# Patient Record
Sex: Female | Born: 1935 | Race: White | Hispanic: No | Marital: Single | Smoking: Never smoker
Health system: Southern US, Community
[De-identification: ages and names within clinical notes are randomized; demographics above are authoritative.]

## PROBLEM LIST (undated history)

## (undated) DIAGNOSIS — I509 Heart failure, unspecified: Secondary | ICD-10-CM

## (undated) DIAGNOSIS — I10 Essential (primary) hypertension: Secondary | ICD-10-CM

## (undated) DIAGNOSIS — F29 Unspecified psychosis not due to a substance or known physiological condition: Secondary | ICD-10-CM

## (undated) DIAGNOSIS — F0150 Vascular dementia without behavioral disturbance: Secondary | ICD-10-CM

## (undated) DIAGNOSIS — F0280 Dementia in other diseases classified elsewhere without behavioral disturbance: Secondary | ICD-10-CM

## (undated) DIAGNOSIS — G309 Alzheimer's disease, unspecified: Secondary | ICD-10-CM

---

## 2012-03-17 ENCOUNTER — Encounter (HOSPITAL_COMMUNITY): Payer: Self-pay | Admitting: *Deleted

## 2012-03-17 ENCOUNTER — Emergency Department (HOSPITAL_COMMUNITY)
Admission: EM | Admit: 2012-03-17 | Discharge: 2012-03-17 | Disposition: A | Discharge status: Home / Self Care | Payer: Medicare Other | Attending: Emergency Medicine | Admitting: Emergency Medicine

## 2012-03-17 ENCOUNTER — Emergency Department (HOSPITAL_COMMUNITY): Payer: Medicare Other

## 2012-03-17 DIAGNOSIS — R1110 Vomiting, unspecified: Secondary | ICD-10-CM | POA: Insufficient documentation

## 2012-03-17 DIAGNOSIS — F0280 Dementia in other diseases classified elsewhere without behavioral disturbance: Secondary | ICD-10-CM | POA: Insufficient documentation

## 2012-03-17 DIAGNOSIS — R197 Diarrhea, unspecified: Secondary | ICD-10-CM | POA: Insufficient documentation

## 2012-03-17 DIAGNOSIS — I10 Essential (primary) hypertension: Secondary | ICD-10-CM | POA: Insufficient documentation

## 2012-03-17 DIAGNOSIS — G309 Alzheimer's disease, unspecified: Secondary | ICD-10-CM | POA: Insufficient documentation

## 2012-03-17 DIAGNOSIS — E860 Dehydration: Secondary | ICD-10-CM | POA: Insufficient documentation

## 2012-03-17 DIAGNOSIS — Z79899 Other long term (current) drug therapy: Secondary | ICD-10-CM | POA: Insufficient documentation

## 2012-03-17 DIAGNOSIS — E876 Hypokalemia: Secondary | ICD-10-CM | POA: Insufficient documentation

## 2012-03-17 DIAGNOSIS — Z8659 Personal history of other mental and behavioral disorders: Secondary | ICD-10-CM | POA: Insufficient documentation

## 2012-03-17 HISTORY — DX: Vascular dementia, unspecified severity, without behavioral disturbance, psychotic disturbance, mood disturbance, and anxiety: F01.50

## 2012-03-17 HISTORY — DX: Unspecified psychosis not due to a substance or known physiological condition: F29

## 2012-03-17 HISTORY — DX: Essential (primary) hypertension: I10

## 2012-03-17 HISTORY — DX: Alzheimer's disease, unspecified: G30.9

## 2012-03-17 HISTORY — DX: Dementia in other diseases classified elsewhere, unspecified severity, without behavioral disturbance, psychotic disturbance, mood disturbance, and anxiety: F02.80

## 2012-03-17 LAB — COMPREHENSIVE METABOLIC PANEL
ALT: 30 U/L (ref 0–35)
Alkaline Phosphatase: 60 U/L (ref 39–117)
BUN: 42 mg/dL — ABNORMAL HIGH (ref 6–23)
CO2: 25 mEq/L (ref 19–32)
Calcium: 9.7 mg/dL (ref 8.4–10.5)
GFR calc Af Amer: 58 mL/min — ABNORMAL LOW (ref >90)
GFR calc non Af Amer: 50 mL/min — ABNORMAL LOW (ref >90)
Glucose, Bld: 373 mg/dL — ABNORMAL HIGH (ref 70–99)
Potassium: 2.8 mEq/L — ABNORMAL LOW (ref 3.5–5.1)
Sodium: 138 mEq/L (ref 135–145)

## 2012-03-17 LAB — URINALYSIS, ROUTINE W REFLEX MICROSCOPIC
Bilirubin Urine: NEGATIVE
Protein, ur: NEGATIVE mg/dL
Urobilinogen, UA: 0.2 mg/dL (ref 0.0–1.0)

## 2012-03-17 LAB — LIPASE, BLOOD: Lipase: 11 U/L (ref 11–59)

## 2012-03-17 LAB — CBC WITH DIFFERENTIAL/PLATELET
Eosinophils Relative: 0 % (ref 0–5)
Hemoglobin: 13.7 g/dL (ref 12.0–15.0)
Lymphocytes Relative: 7 % — ABNORMAL LOW (ref 12–46)
Lymphs Abs: 1 10*3/uL (ref 0.7–4.0)
MCV: 87 fL (ref 78.0–100.0)
Monocytes Relative: 5 % (ref 3–12)
Platelets: 244 10*3/uL (ref 150–400)
RBC: 4.53 MIL/uL (ref 3.87–5.11)
WBC: 13.8 10*3/uL — ABNORMAL HIGH (ref 4.0–10.5)

## 2012-03-17 MED ORDER — MORPHINE SULFATE 4 MG/ML IJ SOLN: 4.0000 mg | Freq: Once | INTRAMUSCULAR | Status: DC

## 2012-03-17 MED ORDER — SODIUM CHLORIDE 0.9 % IV SOLN
1000.0000 mL | INTRAVENOUS | Status: DC
  Administered 2012-03-17 (×2): 1000 mL via INTRAVENOUS

## 2012-03-17 MED ORDER — ONDANSETRON 8 MG PO TBDP: 8.0000 mg | ORAL_TABLET | Freq: Three times a day (TID) | ORAL | Status: DC | PRN

## 2012-03-17 MED ORDER — SODIUM CHLORIDE 0.9 % IV SOLN
1000.0000 mL | Freq: Once | INTRAVENOUS | Status: AC
  Administered 2012-03-17: 1000 mL via INTRAVENOUS

## 2012-03-17 MED ORDER — POTASSIUM CHLORIDE CRYS ER 20 MEQ PO TBCR
40.0000 meq | EXTENDED_RELEASE_TABLET | Freq: Once | ORAL | Status: AC
  Administered 2012-03-17: 40 meq via ORAL
  Filled 2012-03-17: qty 2

## 2012-03-17 MED ORDER — ONDANSETRON HCL 4 MG/2ML IJ SOLN
4.0000 mg | Freq: Once | INTRAMUSCULAR | Status: AC
  Administered 2012-03-17: 4 mg via INTRAVENOUS
  Filled 2012-03-17: qty 2

## 2012-03-17 MED ORDER — POTASSIUM CHLORIDE CRYS ER 20 MEQ PO TBCR: 20.0000 meq | EXTENDED_RELEASE_TABLET | Freq: Two times a day (BID) | ORAL | Status: DC

## 2012-03-17 MED ORDER — POTASSIUM CHLORIDE 10 MEQ/100ML IV SOLN
10.0000 meq | INTRAVENOUS | Status: AC
  Administered 2012-03-17 (×3): 10 meq via INTRAVENOUS
  Filled 2012-03-17 (×3): qty 100

## 2012-03-17 NOTE — ED Notes (Signed)
Returned from XR 

## 2012-03-17 NOTE — ED Notes (Signed)
AVW:[id number]<XB> Expected date:<BR> Expected time:<BR> Means of arrival:<BR> Comments:<BR> 79yo-UTI

## 2012-03-17 NOTE — ED Notes (Signed)
[facility] contacted to transport pt back to [facility].

## 2012-03-17 NOTE — ED Notes (Signed)
Pt arrives by [facility] from [facility] assisted [facility] for c/o abd pain.

## 2012-03-17 NOTE — ED Notes (Signed)
Patient transported to X-ray 

## 2012-03-17 NOTE — Progress Notes (Signed)
CSW confirmed patient is resident at [facility] [place]. Patient plans to return when medically stable. CSW gave rn number to facility [phone].   Marland KitchenCatha Gosselin, Theresia Majors  [phone] .03/17/2012 1902pm

## 2012-03-17 NOTE — Progress Notes (Signed)
CM noted pt without pcp listed CM checked snf forms sent with confused pt Cm found Dr Grace Isaac listed and updated EPIC

## 2012-03-17 NOTE — ED Provider Notes (Signed)
History     CSN: [id number]  Arrival date & time 03/17/12  1140   First MD Initiated Contact with Patient 03/17/12 1155      Chief Complaint  Patient presents with  . Abdominal Pain   Level V caveat for dementia  (Consider location/radiation/quality/duration/timing/severity/associated sxs/prior treatment) HPI  Patient presents via EMS from her assisted living facility. They report to EMS patient has been complaining of abdominal pain and having vomiting and diarrhea. Patient denies having any pain. She cannot tell me if she feels bad.  PCP Dr Nils Flack  Past Medical History  Diagnosis Date  . Alzheimers disease   . Vascular dementia   . Psychosis   . Hypertension     History reviewed. No pertinent past surgical history.  History reviewed. No pertinent family history.  History  Substance Use Topics  . Smoking status: No  . Smokeless tobacco: Not on file  . Alcohol Use: No   lives in a nursing home  OB History   Grav Para Term Preterm Abortions TAB SAB Ect Mult Living                  Review of Systems  Unable to perform ROS: Dementia    Allergies  Review of patient's allergies indicates no known allergies.  Home Medications   Current Outpatient Rx  Name  Route  Sig  Dispense  Refill  . acetaminophen (TYLENOL) 325 MG tablet   Oral   Take 650 mg by mouth every 4 (four) hours as needed for pain.         Marland Kitchen donepezil (ARICEPT) 10 MG tablet   Oral   Take 10 mg by mouth at bedtime as needed.         . Emollient (EUCERIN) lotion   Topical   Apply topically daily. Apply to bilateral arms and legs for dry skin         . Emollient (MINERIN EX)   Apply externally   Apply 1 application topically daily. For eucerin 12's, apply to affected areas (all extremities) every day         . escitalopram (LEXAPRO) 20 MG tablet   Oral   Take 20 mg by mouth daily.         . feeding supplement (ENSURE IMMUNE HEALTH) LIQD   Oral   Take 237 mLs by mouth 2  (two) times daily with a meal. vanilla         . fish oil-omega-3 fatty acids 1000 MG capsule   Oral   Take 1 g by mouth daily.         . furosemide (LASIX) 20 MG tablet   Oral   Take 20 mg by mouth every morning.         . Iloperidone (FANAPT) 2 MG TABS   Oral   Take 1 mg by mouth 2 (two) times daily. To clear thoughts         . lisinopril (PRINIVIL,ZESTRIL) 5 MG tablet   Oral   Take 5 mg by mouth every morning.         . memantine (NAMENDA) 10 MG tablet   Oral   Take 10 mg by mouth 2 (two) times daily.         . Methylfol-Methylcob-Acetylcyst (METAFOLBIC PLUS) 6-2-600 MG TABS   Oral   Take 1 tablet by mouth daily.         . ondansetron (ZOFRAN-ODT) 4 MG disintegrating tablet   Oral   Take  4 mg by mouth every 4 (four) hours as needed for nausea.         . simvastatin (ZOCOR) 10 MG tablet   Oral   Take 10 mg by mouth at bedtime.         . traMADol (ULTRAM) 50 MG tablet   Oral   Take 50 mg by mouth every 6 (six) hours as needed for pain.         . Vitamin D, Ergocalciferol, (DRISDOL) 50000 UNITS CAPS   Oral   Take 50,000 Units by mouth every Tuesday.           BP 187/89  Pulse 101  Temp(Src) 98.5 F (36.9 C) (Oral)  Resp 20  SpO2 98%  ,Vital signs normal except for hypertension, tachycardia   Physical Exam  Nursing note and vitals reviewed. Constitutional: She appears well-developed and well-nourished.  Non-toxic appearance. She does not appear ill. No distress.  Frail elderly female  HENT:  Head: Normocephalic and atraumatic.  Right Ear: External ear normal.  Left Ear: External ear normal.  Nose: Nose normal. No mucosal edema or rhinorrhea.  Mouth/Throat: Mucous membranes are normal. No dental abscesses or edematous.  Tongue and lips are dry and cracked  Eyes: Conjunctivae and EOM are normal. Pupils are equal, round, and reactive to light.  Neck: Normal range of motion and full passive range of motion without pain. Neck supple.   Cardiovascular: Normal rate, regular rhythm and normal heart sounds.  Exam reveals no gallop and no friction rub.   No murmur heard. Pulmonary/Chest: Effort normal and breath sounds normal. No respiratory distress. She has no wheezes. She has no rhonchi. She has no rales. She exhibits no tenderness and no crepitus.  Abdominal: Soft. Normal appearance and bowel sounds are normal. She exhibits no distension. There is no tenderness. There is no rebound and no guarding.  Musculoskeletal: Normal range of motion. She exhibits no edema and no tenderness.  Moves all extremities well.   Neurological: She is alert. She has normal strength. No cranial nerve deficit.  Patient will follow simple commands  Skin: Skin is warm, dry and intact. No rash noted. No erythema. There is pallor.  Psychiatric: She has a normal mood and affect. Her speech is normal and behavior is normal. Her mood appears not anxious.    ED Course  Procedures (including critical care time)  Medications  0.9 %  sodium chloride infusion (0 mLs Intravenous Stopped 03/17/12 1500)    Followed by  0.9 %  sodium chloride infusion (1,000 mLs Intravenous New Bag/Given 03/17/12 1349)  morphine 4 MG/ML injection 4 mg (4 mg Intravenous Not Given 03/17/12 1242)  potassium chloride 10 mEq in 100 mL IVPB (10 mEq Intravenous New Bag/Given 03/17/12 1529)  ondansetron (ZOFRAN) injection 4 mg (4 mg Intravenous Given 03/17/12 1418)  potassium chloride SA (K-DUR,KLOR-CON) CR tablet 40 mEq (40 mEq Oral Given 03/17/12 1443)   Patient's family here. They state she started having vomiting and diarrhea Saturday. They stated she seemed okay yesterday and was drinking gatorade.   Patient continues to not have any pain. She's not had any vomiting or diarrhea and her ED visit.  She had her hypokalemia supplemented with IV and oral potassium.  Results for orders placed during the hospital encounter of 03/17/12  CBC WITH DIFFERENTIAL      Result Value Range    WBC 13.8 (*) 4.0 - 10.5 K/uL   RBC 4.53  3.87 - 5.11 MIL/uL   Hemoglobin 13.7  12.0 - 15.0 g/dL   HCT 16.1  09.6 - 04.5 %   MCV 87.0  78.0 - 100.0 fL   MCH 30.2  26.0 - 34.0 pg   MCHC 34.8  30.0 - 36.0 g/dL   RDW 40.9  81.1 - 91.4 %   Platelets 244  150 - 400 K/uL   Neutrophils Relative 87 (*) 43 - 77 %   Neutro Abs 12.0 (*) 1.7 - 7.7 K/uL   Lymphocytes Relative 7 (*) 12 - 46 %   Lymphs Abs 1.0  0.7 - 4.0 K/uL   Monocytes Relative 5  3 - 12 %   Monocytes Absolute 0.7  0.1 - 1.0 K/uL   Eosinophils Relative 0  0 - 5 %   Eosinophils Absolute 0.0  0.0 - 0.7 K/uL   Basophils Relative 0  0 - 1 %   Basophils Absolute 0.0  0.0 - 0.1 K/uL  COMPREHENSIVE METABOLIC PANEL      Result Value Range   Sodium 138  135 - 145 mEq/L   Potassium 2.8 (*) 3.5 - 5.1 mEq/L   Chloride 98  96 - 112 mEq/L   CO2 25  19 - 32 mEq/L   Glucose, Bld 373 (*) 70 - 99 mg/dL   BUN 42 (*) 6 - 23 mg/dL   Creatinine, Ser 7.82  0.50 - 1.10 mg/dL   Calcium 9.7  8.4 - 95.6 mg/dL   Total Protein 7.2  6.0 - 8.3 g/dL   Albumin 4.3  3.5 - 5.2 g/dL   AST 21  0 - 37 U/L   ALT 30  0 - 35 U/L   Alkaline Phosphatase 60  39 - 117 U/L   Total Bilirubin 1.0  0.3 - 1.2 mg/dL   GFR calc non Af Amer 50 (*) >90 mL/min   GFR calc Af Amer 58 (*) >90 mL/min  LIPASE, BLOOD      Result Value Range   Lipase 11  11 - 59 U/L  URINALYSIS, ROUTINE W REFLEX MICROSCOPIC      Result Value Range   Color, Urine YELLOW  YELLOW   APPearance CLEAR  CLEAR   Specific Gravity, Urine 1.017  1.005 - 1.030   pH 5.0  5.0 - 8.0   Glucose, UA 500 (*) NEGATIVE mg/dL   Hgb urine dipstick TRACE (*) NEGATIVE   Bilirubin Urine NEGATIVE  NEGATIVE   Ketones, ur NEGATIVE  NEGATIVE mg/dL   Protein, ur NEGATIVE  NEGATIVE mg/dL   Urobilinogen, UA 0.2  0.0 - 1.0 mg/dL   Nitrite NEGATIVE  NEGATIVE   Leukocytes, UA NEGATIVE  NEGATIVE  URINE MICROSCOPIC-ADD ON      Result Value Range   Squamous Epithelial / LPF RARE  RARE   WBC, UA 0-2  <3 WBC/hpf   RBC / HPF  0-2  <3 RBC/hpf   Urine-Other AMORPHOUS URATES/PHOSPHATES     Laboratory interpretation all normal except for leukocytosis consistent with vomiting, hyperglycemia, elevated BUN consistent with dehydration    Dg Abd Acute W/chest  03/17/2012  *RADIOLOGY REPORT*  Clinical Data: Abdominal pain.  History of dementia  ACUTE ABDOMEN SERIES (ABDOMEN 2 VIEW & CHEST 1 VIEW)  Comparison: None.  Findings: Heart size is normal.  No pleural effusion or edema.  No airspace consolidation identified.  Chronic interstitial coarsening is noted bilaterally.  Degenerative type changes noted within both glenohumeral joints.  Cholecystectomy clips noted in the right upper quadrant of the abdomen.  There are no dilated loops of  small bowel or air-fluid level.  Scoliosis deformity and multilevel degenerative disc disease noted within the lumbar spine.  IMPRESSION:  1.  No acute cardiopulmonary abnormalities. 2.  Nonobstructive bowel gas pattern.   Original Report Authenticated By: Signa Kell, M.D.      1. Vomiting and diarrhea   2. Dehydration   3. Hypokalemia    New Prescriptions   ONDANSETRON (ZOFRAN ODT) 8 MG DISINTEGRATING TABLET    Take 1 tablet (8 mg total) by mouth every 8 (eight) hours as needed for nausea.   POTASSIUM CHLORIDE SA (K-DUR,KLOR-CON) 20 MEQ TABLET    Take 1 tablet (20 mEq total) by mouth 2 (two) times daily.    Plan discharge  Devoria Albe, MD, FACEP    MDM          Ward Givens, MD 03/17/12 [phone]

## 2012-04-16 ENCOUNTER — Inpatient Hospital Stay (HOSPITAL_COMMUNITY)
Admission: EM | Admit: 2012-04-16 | Discharge: 2012-04-23 | DRG: 690 | Disposition: A | Discharge status: Skilled Nursing Facility | Payer: Medicare Other | Attending: Internal Medicine | Admitting: Internal Medicine

## 2012-04-16 ENCOUNTER — Encounter (HOSPITAL_COMMUNITY): Payer: Self-pay | Admitting: *Deleted

## 2012-04-16 DIAGNOSIS — N390 Urinary tract infection, site not specified: Principal | ICD-10-CM | POA: Diagnosis present

## 2012-04-16 DIAGNOSIS — E870 Hyperosmolality and hypernatremia: Secondary | ICD-10-CM | POA: Diagnosis present

## 2012-04-16 DIAGNOSIS — K5900 Constipation, unspecified: Secondary | ICD-10-CM | POA: Diagnosis not present

## 2012-04-16 DIAGNOSIS — I498 Other specified cardiac arrhythmias: Secondary | ICD-10-CM | POA: Diagnosis present

## 2012-04-16 DIAGNOSIS — R4701 Aphasia: Secondary | ICD-10-CM | POA: Diagnosis present

## 2012-04-16 DIAGNOSIS — A498 Other bacterial infections of unspecified site: Secondary | ICD-10-CM | POA: Diagnosis present

## 2012-04-16 DIAGNOSIS — W06XXXA Fall from bed, initial encounter: Secondary | ICD-10-CM | POA: Diagnosis present

## 2012-04-16 DIAGNOSIS — G309 Alzheimer's disease, unspecified: Secondary | ICD-10-CM | POA: Diagnosis present

## 2012-04-16 DIAGNOSIS — Y998 Other external cause status: Secondary | ICD-10-CM

## 2012-04-16 DIAGNOSIS — R197 Diarrhea, unspecified: Secondary | ICD-10-CM | POA: Diagnosis not present

## 2012-04-16 DIAGNOSIS — F0280 Dementia in other diseases classified elsewhere without behavioral disturbance: Secondary | ICD-10-CM | POA: Diagnosis present

## 2012-04-16 DIAGNOSIS — F0390 Unspecified dementia without behavioral disturbance: Secondary | ICD-10-CM | POA: Diagnosis present

## 2012-04-16 DIAGNOSIS — I10 Essential (primary) hypertension: Secondary | ICD-10-CM | POA: Diagnosis present

## 2012-04-16 DIAGNOSIS — Z66 Do not resuscitate: Secondary | ICD-10-CM | POA: Diagnosis present

## 2012-04-16 DIAGNOSIS — I509 Heart failure, unspecified: Secondary | ICD-10-CM | POA: Diagnosis present

## 2012-04-16 DIAGNOSIS — IMO0002 Reserved for concepts with insufficient information to code with codable children: Secondary | ICD-10-CM | POA: Diagnosis present

## 2012-04-16 DIAGNOSIS — Y9210 Unspecified residential institution as the place of occurrence of the external cause: Secondary | ICD-10-CM | POA: Diagnosis present

## 2012-04-16 DIAGNOSIS — Z79899 Other long term (current) drug therapy: Secondary | ICD-10-CM

## 2012-04-16 DIAGNOSIS — E860 Dehydration: Secondary | ICD-10-CM | POA: Diagnosis not present

## 2012-04-16 DIAGNOSIS — R739 Hyperglycemia, unspecified: Secondary | ICD-10-CM

## 2012-04-16 DIAGNOSIS — E118 Type 2 diabetes mellitus with unspecified complications: Secondary | ICD-10-CM | POA: Diagnosis present

## 2012-04-16 DIAGNOSIS — W19XXXA Unspecified fall, initial encounter: Secondary | ICD-10-CM

## 2012-04-16 HISTORY — DX: Heart failure, unspecified: I50.9

## 2012-04-16 NOTE — ED Notes (Signed)
[facility]: according to staff, pt. Rolled out of bed..unwitnessed. Fire department: confusion .Marland Kitchenhx of dementia/confused.

## 2012-04-17 ENCOUNTER — Emergency Department (HOSPITAL_COMMUNITY): Payer: Medicare Other

## 2012-04-17 ENCOUNTER — Encounter (HOSPITAL_COMMUNITY): Payer: Self-pay | Admitting: *Deleted

## 2012-04-17 DIAGNOSIS — R197 Diarrhea, unspecified: Secondary | ICD-10-CM | POA: Diagnosis not present

## 2012-04-17 DIAGNOSIS — I10 Essential (primary) hypertension: Secondary | ICD-10-CM | POA: Diagnosis present

## 2012-04-17 DIAGNOSIS — F0280 Dementia in other diseases classified elsewhere without behavioral disturbance: Secondary | ICD-10-CM | POA: Diagnosis present

## 2012-04-17 DIAGNOSIS — IMO0002 Reserved for concepts with insufficient information to code with codable children: Secondary | ICD-10-CM | POA: Diagnosis present

## 2012-04-17 DIAGNOSIS — Y9210 Unspecified residential institution as the place of occurrence of the external cause: Secondary | ICD-10-CM | POA: Diagnosis present

## 2012-04-17 DIAGNOSIS — E860 Dehydration: Secondary | ICD-10-CM

## 2012-04-17 DIAGNOSIS — K5900 Constipation, unspecified: Secondary | ICD-10-CM | POA: Diagnosis not present

## 2012-04-17 DIAGNOSIS — W06XXXA Fall from bed, initial encounter: Secondary | ICD-10-CM | POA: Diagnosis present

## 2012-04-17 DIAGNOSIS — R4701 Aphasia: Secondary | ICD-10-CM | POA: Diagnosis present

## 2012-04-17 DIAGNOSIS — E870 Hyperosmolality and hypernatremia: Secondary | ICD-10-CM | POA: Diagnosis present

## 2012-04-17 DIAGNOSIS — A498 Other bacterial infections of unspecified site: Secondary | ICD-10-CM | POA: Diagnosis present

## 2012-04-17 DIAGNOSIS — Y998 Other external cause status: Secondary | ICD-10-CM | POA: Diagnosis not present

## 2012-04-17 DIAGNOSIS — I509 Heart failure, unspecified: Secondary | ICD-10-CM | POA: Diagnosis present

## 2012-04-17 DIAGNOSIS — Z79899 Other long term (current) drug therapy: Secondary | ICD-10-CM | POA: Diagnosis not present

## 2012-04-17 DIAGNOSIS — N390 Urinary tract infection, site not specified: Secondary | ICD-10-CM | POA: Diagnosis present

## 2012-04-17 DIAGNOSIS — W19XXXA Unspecified fall, initial encounter: Secondary | ICD-10-CM

## 2012-04-17 DIAGNOSIS — I498 Other specified cardiac arrhythmias: Secondary | ICD-10-CM | POA: Diagnosis present

## 2012-04-17 DIAGNOSIS — F0390 Unspecified dementia without behavioral disturbance: Secondary | ICD-10-CM | POA: Diagnosis present

## 2012-04-17 DIAGNOSIS — E118 Type 2 diabetes mellitus with unspecified complications: Secondary | ICD-10-CM | POA: Diagnosis present

## 2012-04-17 LAB — URINALYSIS, MICROSCOPIC ONLY
Bilirubin Urine: NEGATIVE
Ketones, ur: NEGATIVE mg/dL
Nitrite: POSITIVE — AB
Protein, ur: 30 mg/dL — AB
Specific Gravity, Urine: 1.028 (ref 1.005–1.030)
Urobilinogen, UA: 0.2 mg/dL (ref 0.0–1.0)

## 2012-04-17 LAB — POCT I-STAT, CHEM 8
BUN: 33 mg/dL — ABNORMAL HIGH (ref 6–23)
Calcium, Ion: 1.15 mmol/L (ref 1.13–1.30)
Chloride: 111 mEq/L (ref 96–112)
Creatinine, Ser: 1 mg/dL (ref 0.50–1.10)
Glucose, Bld: 399 mg/dL — ABNORMAL HIGH (ref 70–99)
HCT: 42 % (ref 36.0–46.0)
Potassium: 3.7 mEq/L (ref 3.5–5.1)

## 2012-04-17 LAB — BASIC METABOLIC PANEL
BUN: 29 mg/dL — ABNORMAL HIGH (ref 6–23)
CO2: 26 mEq/L (ref 19–32)
Chloride: 117 mEq/L — ABNORMAL HIGH (ref 96–112)
Glucose, Bld: 286 mg/dL — ABNORMAL HIGH (ref 70–99)
Potassium: 3.2 mEq/L — ABNORMAL LOW (ref 3.5–5.1)
Sodium: 152 mEq/L — ABNORMAL HIGH (ref 135–145)

## 2012-04-17 LAB — POCT I-STAT 3, VENOUS BLOOD GAS (G3P V)
Acid-Base Excess: 3 mmol/L — ABNORMAL HIGH (ref 0.0–2.0)
Bicarbonate: 26.8 mEq/L — ABNORMAL HIGH (ref 20.0–24.0)
pCO2, Ven: 36 mmHg — ABNORMAL LOW (ref 45.0–50.0)
pH, Ven: 7.48 — ABNORMAL HIGH (ref 7.250–7.300)
pO2, Ven: 52 mmHg — ABNORMAL HIGH (ref 30.0–45.0)

## 2012-04-17 LAB — GLUCOSE, CAPILLARY: Glucose-Capillary: 162 mg/dL — ABNORMAL HIGH (ref 70–99)

## 2012-04-17 LAB — TROPONIN I: Troponin I: <0.3 ng/mL (ref <0.30)

## 2012-04-17 LAB — HEMOGLOBIN A1C
Hgb A1c MFr Bld: 8.8 % — ABNORMAL HIGH (ref <5.7)
Mean Plasma Glucose: 206 mg/dL — ABNORMAL HIGH (ref <117)

## 2012-04-17 LAB — KETONES, QUALITATIVE

## 2012-04-17 LAB — PROTIME-INR: Prothrombin Time: 14.6 seconds (ref 11.6–15.2)

## 2012-04-17 MED ORDER — ENSURE IMMUNE HEALTH PO LIQD: 237.0000 mL | Freq: Two times a day (BID) | ORAL | Status: DC

## 2012-04-17 MED ORDER — MEMANTINE HCL 10 MG PO TABS
10.0000 mg | ORAL_TABLET | Freq: Two times a day (BID) | ORAL | Status: DC
  Administered 2012-04-18 – 2012-04-23 (×11): 10 mg via ORAL
  Filled 2012-04-17 (×14): qty 1

## 2012-04-17 MED ORDER — SENNA 8.6 MG PO TABS
1.0000 | ORAL_TABLET | Freq: Two times a day (BID) | ORAL | Status: DC
  Administered 2012-04-18 – 2012-04-23 (×9): 8.6 mg via ORAL
  Filled 2012-04-17 (×14): qty 1

## 2012-04-17 MED ORDER — DEXTROSE 5 % IV SOLN
INTRAVENOUS | Status: DC
  Administered 2012-04-17 – 2012-04-19 (×4): via INTRAVENOUS

## 2012-04-17 MED ORDER — ENSURE COMPLETE PO LIQD
237.0000 mL | Freq: Two times a day (BID) | ORAL | Status: DC
  Administered 2012-04-18 – 2012-04-23 (×10): 237 mL via ORAL

## 2012-04-17 MED ORDER — ONDANSETRON HCL 4 MG/2ML IJ SOLN: 4.0000 mg | Freq: Three times a day (TID) | INTRAMUSCULAR | Status: AC | PRN

## 2012-04-17 MED ORDER — INSULIN ASPART 100 UNIT/ML ~~LOC~~ SOLN
5.0000 [IU] | Freq: Once | SUBCUTANEOUS | Status: AC
  Administered 2012-04-17: 5 [IU] via INTRAVENOUS
  Filled 2012-04-17: qty 5

## 2012-04-17 MED ORDER — SIMVASTATIN 20 MG PO TABS
20.0000 mg | ORAL_TABLET | Freq: Every evening | ORAL | Status: DC
  Administered 2012-04-18 – 2012-04-22 (×5): 20 mg via ORAL
  Filled 2012-04-17 (×7): qty 1

## 2012-04-17 MED ORDER — INSULIN ASPART 100 UNIT/ML ~~LOC~~ SOLN
0.0000 [IU] | Freq: Three times a day (TID) | SUBCUTANEOUS | Status: DC
  Administered 2012-04-17: 2 [IU] via SUBCUTANEOUS
  Administered 2012-04-17: 7 [IU] via SUBCUTANEOUS
  Administered 2012-04-17: 3 [IU] via SUBCUTANEOUS
  Administered 2012-04-18: 2 [IU] via SUBCUTANEOUS
  Administered 2012-04-18 (×2): 5 [IU] via SUBCUTANEOUS

## 2012-04-17 MED ORDER — SODIUM CHLORIDE 0.45 % IV SOLN
INTRAVENOUS | Status: DC
  Administered 2012-04-17: 05:00:00 via INTRAVENOUS

## 2012-04-17 MED ORDER — CHLORHEXIDINE GLUCONATE CLOTH 2 % EX PADS
6.0000 | MEDICATED_PAD | Freq: Every day | CUTANEOUS | Status: AC
  Administered 2012-04-17 – 2012-04-21 (×5): 6 via TOPICAL

## 2012-04-17 MED ORDER — DEXTROSE 5 % IV SOLN
1.0000 g | INTRAVENOUS | Status: DC
  Administered 2012-04-17 – 2012-04-22 (×7): 1 g via INTRAVENOUS
  Filled 2012-04-17 (×6): qty 10

## 2012-04-17 MED ORDER — SODIUM CHLORIDE 0.9 % IV BOLUS (SEPSIS)
1000.0000 mL | Freq: Once | INTRAVENOUS | Status: AC
  Administered 2012-04-17: 1000 mL via INTRAVENOUS

## 2012-04-17 MED ORDER — ACETAMINOPHEN 650 MG RE SUPP
650.0000 mg | Freq: Four times a day (QID) | RECTAL | Status: DC | PRN
  Administered 2012-04-17: 650 mg via RECTAL
  Filled 2012-04-17 (×2): qty 1

## 2012-04-17 MED ORDER — MUPIROCIN 2 % EX OINT
1.0000 "application " | TOPICAL_OINTMENT | Freq: Two times a day (BID) | CUTANEOUS | Status: AC
  Administered 2012-04-17 – 2012-04-21 (×10): 1 via NASAL
  Filled 2012-04-17 (×2): qty 22

## 2012-04-17 MED ORDER — SODIUM CHLORIDE 0.9 % IV SOLN
INTRAVENOUS | Status: DC
  Filled 2012-04-17: qty 1

## 2012-04-17 MED ORDER — DEXTROSE 5 % IV SOLN: 1.0000 g | INTRAVENOUS | Status: DC

## 2012-04-17 MED ORDER — ENOXAPARIN SODIUM 40 MG/0.4ML ~~LOC~~ SOLN
40.0000 mg | SUBCUTANEOUS | Status: DC
  Administered 2012-04-17 – 2012-04-23 (×5): 40 mg via SUBCUTANEOUS
  Filled 2012-04-17 (×9): qty 0.4

## 2012-04-17 MED ORDER — SODIUM CHLORIDE 0.9 % IV SOLN
INTRAVENOUS | Status: AC
  Administered 2012-04-17: 03:00:00 via INTRAVENOUS

## 2012-04-17 MED ORDER — TRAMADOL HCL 50 MG PO TABS
50.0000 mg | ORAL_TABLET | Freq: Four times a day (QID) | ORAL | Status: DC | PRN
  Filled 2012-04-17: qty 1

## 2012-04-17 MED ORDER — LISINOPRIL 5 MG PO TABS
5.0000 mg | ORAL_TABLET | Freq: Every morning | ORAL | Status: DC
  Administered 2012-04-18: 5 mg via ORAL
  Filled 2012-04-17 (×2): qty 1

## 2012-04-17 MED ORDER — ILOPERIDONE 2 MG PO TABS
1.0000 mg | ORAL_TABLET | Freq: Two times a day (BID) | ORAL | Status: DC
  Administered 2012-04-19 – 2012-04-23 (×9): 1 mg via ORAL
  Filled 2012-04-17 (×17): qty 1

## 2012-04-17 MED ORDER — ESCITALOPRAM OXALATE 20 MG PO TABS
20.0000 mg | ORAL_TABLET | Freq: Every day | ORAL | Status: DC
  Administered 2012-04-18 – 2012-04-23 (×6): 20 mg via ORAL
  Filled 2012-04-17 (×7): qty 1

## 2012-04-17 MED ORDER — DONEPEZIL HCL 10 MG PO TABS
10.0000 mg | ORAL_TABLET | Freq: Every day | ORAL | Status: DC
  Administered 2012-04-19 – 2012-04-22 (×5): 10 mg via ORAL
  Filled 2012-04-17 (×7): qty 1

## 2012-04-17 NOTE — H&P (Signed)
[facility] ADMISSION NOTE  History and Physical  [name] [name] ZOX:[id number] DOB: 1921/05/07 DOA: 04/16/2012  Referring physician: ER PCP: Jari Favre, MD   Chief Complaint: fall  HPI: 79 year old woman with past medical history significant for alzheimer's disease , resident of [facility] nursing [facility] presents to the ED with a fall from the bed. Patient is completely aphasic from advanced dementia and all the history has been obtained from ER chart and son.  She was noted to have abrasions on her legs. Labs suggested hyperglycemia and hypernatremia. ER physician wanted to admit for rehydration.  Review of Systems:  Unable to perform ROS because of dementia.  Past Medical History  Diagnosis Date  . Alzheimers disease   . Vascular dementia   . Psychosis   . Hypertension   . CHF (congestive heart failure)     No past surgical history on file.  Social History:  has no tobacco, alcohol, and drug history on file.  No Known Allergies  No family history on file.   Prior to Admission medications   Medication Sig Start Date End Date Taking? Authorizing Provider  acetaminophen (TYLENOL) 325 MG tablet Take 650 mg by mouth every 4 (four) hours as needed for pain.   Yes Historical Provider, MD  bismuth subsalicylate (PEPTO BISMOL) 262 MG/15ML suspension Take 30 mLs by mouth every hour as needed for indigestion (Max 4 doses/24 hours.).   Yes Historical Provider, MD  donepezil (ARICEPT) 10 MG tablet Take 10 mg by mouth at bedtime.    Yes Historical Provider, MD  Emollient (EUCERIN) lotion Apply topically daily. Apply to bilateral arms and legs for dry skin   Yes Historical Provider, MD  escitalopram (LEXAPRO) 20 MG tablet Take 20 mg by mouth daily.   Yes Historical Provider, MD  feeding supplement (ENSURE IMMUNE HEALTH) LIQD Take 237 mLs by mouth 2 (two) times daily with a meal. Vanilla.   Yes Historical Provider, MD  fish oil-omega-3 fatty acids 1000 MG capsule Take 1 g by mouth  daily.   Yes Historical Provider, MD  furosemide (LASIX) 20 MG tablet Take 20 mg by mouth every morning.   Yes Historical Provider, MD  guaifenesin (ROBITUSSIN) 100 MG/5ML syrup Take 100 mg by mouth 3 (three) times daily as needed for cough.   Yes Historical Provider, MD  Iloperidone (FANAPT) 2 MG TABS Take 1 mg by mouth 2 (two) times daily. To clear thoughts   Yes Historical Provider, MD  lisinopril (PRINIVIL,ZESTRIL) 5 MG tablet Take 5 mg by mouth every morning.   Yes Historical Provider, MD  memantine (NAMENDA) 10 MG tablet Take 10 mg by mouth 2 (two) times daily.   Yes Historical Provider, MD  Methylfol-Methylcob-Acetylcyst (METAFOLBIC PLUS) 6-2-600 MG TABS Take 1 tablet by mouth daily.   Yes Historical Provider, MD  ondansetron (ZOFRAN ODT) 8 MG disintegrating tablet Take 1 tablet (8 mg total) by mouth every 8 (eight) hours as needed for nausea. 03/17/12  Yes Ward Givens, MD  potassium chloride SA (K-DUR,KLOR-CON) 20 MEQ tablet Take 1 tablet (20 mEq total) by mouth 2 (two) times daily. 03/17/12  Yes Ward Givens, MD  simvastatin (ZOCOR) 20 MG tablet Take 20 mg by mouth every evening.   Yes Historical Provider, MD  traMADol (ULTRAM) 50 MG tablet Take 50 mg by mouth every 6 (six) hours as needed for pain.   Yes Historical Provider, MD  Vitamin D, Ergocalciferol, (DRISDOL) 50000 UNITS CAPS Take 50,000 Units by mouth every Tuesday.  Yes Historical Provider, MD   Physical Exam: Filed Vitals:   04/17/12 0100 04/17/12 0115 04/17/12 0130 04/17/12 0145  BP: 141/87 139/76 142/78 148/66  Pulse: 127 121 97 116  Temp:  97.3 F (36.3 C) 97.9 F (36.6 C) 97.7 F (36.5 C)  Resp: 17 16 12 16   SpO2: 97% 98% 96% 99%   Constitutional:  She appears well-developed and well-nourished.  HENT: Normocephalic and atraumatic. Dry mucous membranes Conjunctivae and EOM are normal. Pupils are equal, round, and reactive to light.  Neck: Neck supple, no lymphadenopathy Cardiovascular: RRR. No murmur  heard. Pulmonary/Chest: Effort normal and breath sounds normal. No respiratory distress.  Abdominal: Soft, non tender, positive bowel sounds, no organomegaly Musculoskeletal: Normal range of motion. She exhibits no edema and no tenderness. Abrasion right shin  Neurological: Difficult to obtain as she does not follow Commands. Skin: Skin is warm.    Wt Readings from Last 3 Encounters:  No data found for Wt    Labs on Admission:  Basic Metabolic Panel:  Recent Labs Lab 04/17/12 0040  NA 149*  K 3.7  CL 111  GLUCOSE 399*  BUN 33*  CREATININE 1.00     CBC:  Recent Labs Lab 04/17/12 0040  HGB 14.3  HCT 42.0    Cardiac Enzymes:  Recent Labs Lab 04/17/12 0008  TROPONINI <0.30    CBG:  Recent Labs Lab 04/16/12 2351  GLUCAP 357*     Radiological Exams on Admission: Dg Chest Portable 1 View  04/17/2012  *RADIOLOGY REPORT*  Clinical Data: Altered level of consciousness.  Fell.  PORTABLE CHEST - 1 VIEW  Comparison: 03/17/2012.  Findings: The cardiac silhouette, mediastinal and hilar contours are normal and stable.  There is tortuosity and calcification of the thoracic aorta.  The lungs are clear of acute process.  No pleural effusion.  The bony thorax is intact.  IMPRESSION: No acute cardiopulmonary findings.   Original Report Authenticated By: Rudie Meyer, M.D.     EKG: Independently reviewed. 137 BPM, sinus tachycardia, non specific st and t wave changes. No previous ekg   Principal Problem:   Dehydration Active Problems:   Fall   UTI (lower urinary tract infection)   Dementia   CHF (congestive heart failure)   HTN (hypertension)   Assessment/Plan:  Fall: Patient presents from nursing home with a fall. The exact nature of her fall is unclear but could be elated to hypernatremia, dehydration and infection(UTI). She was noted to be tachycardic and hyperglycemic in the ED and was also found to have a UTI. No trauma related injury noted. - Admit to med-  surg bed - IVF - Start rocephin -Hold antihypertensives and diuretic -improve access to water [facility]  UTI: UA positive for nitrites and leucocytes with too numerous to count WBC's. - Start rocephin. - await cultures  Hypernatremia: Likely dehydration Start IVF 1/2 NS and follow BMP.  Hyperglycemia: Check Hba1c SSI  Dementia: as per the son, patient appears to be at baseline. Continue home meds.    Code Status: DNI/DNR Family Communication: Patient's son was updated on the plan of care.  Disposition Plan/Anticipated LOS: Likely in 2-3 days  Time spent: 70 minutes  Lars Mage, MD  [facility] Team 5  If 7PM-7AM, please contact night-coverage at [email or web address], password [facility] 04/17/2012, 2:20 AM

## 2012-04-17 NOTE — Progress Notes (Signed)
Pt arrived to the floor via stretcher accompanied by ED staff. Transferred to the bed and admission assessment completed. Bed lowered to lowest position, wheels locked, and bed alarm turned on. Pt has no signs or symptoms of pain or shortness of breath. Unable to complete admission history due to patient unresponsive and no family present. ED staff charted Stage 1 to buttocks no signs or symptoms of decubitus ulcers on buttocks. Will continue to assess pt periodically.

## 2012-04-17 NOTE — Progress Notes (Addendum)
SLP Cancellation Note  Patient Details Name: [name] [name] MRN: [id number] DOB: 1921/07/17   Cancelled treatment:     Attempted to perform swallow assessment.  Pt. Sleeping, easily arousable, reddened face, grimacing and holding her left arm as if in a great deal of pain.  Notified  RN who is speaking now with MD.  Unable to participate in assessment.  Plan:  Will return next date.    Breck Coons [place].Ed [employer] [phone]  04/17/2012

## 2012-04-17 NOTE — Clinical Social Work Note (Signed)
Patient from [facility] facility. CSW will make contact with patient/family and provide psychosocial services as needed and assist with discharge back to ALF when patient medically stable.  Genelle Bal, MSW, LCSW [phone]

## 2012-04-17 NOTE — ED Provider Notes (Signed)
History     CSN: [id number]  Arrival date & time 04/16/12  2342   First MD Initiated Contact with Patient 04/16/12 2349      No chief complaint on file.   (Consider location/radiation/quality/duration/timing/severity/associated sxs/prior treatment) HPI Comments: Demented patient from nursing home presenting with fall from bed. She is unable to provide a history. She is not be tachycardic and hyperglycemic. Son states she is at her baseline mentation. His history of Alzheimer's. She has abrasions to her lower extremities. She is very dry appearing and tachycardic.  The history is provided by the patient.    Past Medical History  Diagnosis Date  . Alzheimers disease   . Vascular dementia   . Psychosis   . Hypertension   . CHF (congestive heart failure)     History reviewed. No pertinent past surgical history.  No family history on file.  History  Substance Use Topics  . Smoking status: Unknown If Ever Smoked  . Smokeless tobacco: Not on file  . Alcohol Use: No    OB History   Grav Para Term Preterm Abortions TAB SAB Ect Mult Living                  Review of Systems  Unable to perform ROS: Dementia    Allergies  Review of patient's allergies indicates no known allergies.  Home Medications   No current outpatient prescriptions on file.  BP 127/52  Pulse 99  Temp(Src) 98.4 F (36.9 C) (Axillary)  Resp 18  Ht 5\' 3"  (1.6 m)  Wt 119 lb 7.8 oz (54.2 kg)  BMI 21.17 kg/m2  SpO2 99%  Physical Exam  Constitutional: She is oriented to person, place, and time. She appears well-developed and well-nourished.  HENT:  Head: Normocephalic and atraumatic.  Mouth/Throat: Oropharynx is clear and moist.  Extremely dry mucous membranes  Eyes: Conjunctivae and EOM are normal. Pupils are equal, round, and reactive to light.  Neck: Normal range of motion. Neck supple.  Cardiovascular: Normal rate and regular rhythm.   No murmur heard.  tachycardic  Pulmonary/Chest:  Effort normal and breath sounds normal. No respiratory distress.  Abdominal: Soft. Bowel sounds are normal. There is no tenderness. There is no rebound and no guarding.  Musculoskeletal: Normal range of motion. She exhibits no edema and no tenderness.  Abrasion right shin  Neurological: She is alert and oriented to person, place, and time. No cranial nerve deficit. She exhibits normal muscle tone. Coordination normal.  Skin: Skin is warm.    ED Course  Fecal disimpaction Date/Time: 04/17/2012 1:07 AM Performed by: Glynn Octave Authorized by: Glynn Octave Consent: Verbal consent obtained. Risks and benefits: risks, benefits and alternatives were discussed Consent given by: patient Patient identity confirmed: verbally with patient Local anesthesia used: no Patient sedated: no Patient tolerance: Patient tolerated the procedure well with no immediate complications.   (including critical care time)  Labs Reviewed  URINALYSIS, MICROSCOPIC ONLY - Abnormal; Notable for the following:    APPearance TURBID (*)    Glucose, UA >1000 (*)    Hgb urine dipstick LARGE (*)    Protein, ur 30 (*)    Nitrite POSITIVE (*)    Leukocytes, UA LARGE (*)    Bacteria, UA MANY (*)    All other components within normal limits  KETONES, QUALITATIVE - Abnormal; Notable for the following:    Acetone, Bld SMALL (*)    All other components within normal limits  GLUCOSE, CAPILLARY - Abnormal; Notable for the  following:    Glucose-Capillary 357 (*)    All other components within normal limits  GLUCOSE, CAPILLARY - Abnormal; Notable for the following:    Glucose-Capillary 306 (*)    All other components within normal limits  GLUCOSE, CAPILLARY - Abnormal; Notable for the following:    Glucose-Capillary 246 (*)    All other components within normal limits  POCT I-STAT 3, BLOOD GAS (G3P V) - Abnormal; Notable for the following:    pH, Ven 7.480 (*)    pCO2, Ven 36.0 (*)    pO2, Ven 52.0 (*)     Bicarbonate 26.8 (*)    Acid-Base Excess 3.0 (*)    All other components within normal limits  POCT I-STAT, CHEM 8 - Abnormal; Notable for the following:    Sodium 149 (*)    BUN 33 (*)    Glucose, Bld 399 (*)    All other components within normal limits  CULTURE, BLOOD (ROUTINE X 2)  CULTURE, BLOOD (ROUTINE X 2)  URINE CULTURE  MRSA PCR SCREENING  LACTIC ACID, PLASMA  PROTIME-INR  TROPONIN I  CBC WITH DIFFERENTIAL  URINALYSIS, ROUTINE W REFLEX MICROSCOPIC  BASIC METABOLIC PANEL  HEMOGLOBIN A1C   Ct Head Wo Contrast  04/17/2012  *RADIOLOGY REPORT*  Clinical Data: Altered mental status.  Fall from bed.  CT HEAD WITHOUT CONTRAST  Technique:  Contiguous axial images were obtained from the base of the skull through the vertex without contrast.  Comparison: None.  Findings: No mass lesion, mass effect, midline shift, hydrocephalus, hemorrhage.  No acute territorial cortical ischemia/infarct. Atrophy and chronic ischemic white matter disease is present.  Old left thalamic lacunar infarct.  Calvarium intact. No skull fracture.  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  IMPRESSION: Atrophy and chronic ischemic white matter disease without acute intracranial abnormality.   Original Report Authenticated By: Andreas Newport, M.D.    Dg Chest Portable 1 View  04/17/2012  *RADIOLOGY REPORT*  Clinical Data: Altered level of consciousness.  Fell.  PORTABLE CHEST - 1 VIEW  Comparison: 03/17/2012.  Findings: The cardiac silhouette, mediastinal and hilar contours are normal and stable.  There is tortuosity and calcification of the thoracic aorta.  The lungs are clear of acute process.  No pleural effusion.  The bony thorax is intact.  IMPRESSION: No acute cardiopulmonary findings.   Original Report Authenticated By: Rudie Meyer, M.D.      1. Dehydration   2. Urinary tract infection   3. Hyperglycemia       MDM  Dementia patient with fall from nursing home. Appears very  dehydrated with tachycardia and hyperglycemia. No focal neurological deficits but does not follow commands.  Elevated heart rate improved after fecal disimpaction and IV hydration. Evidence of dehydration with hypernatremia, hyperglycemia, UTI.  Ketones in blood, likely from dehydrated state. Doubt DKA with normal anion gap.  Normal anion gap. Patient given IV fluids, IV insulin, IV antibiotics. We'll admit for further care.     Date: 04/17/2012  Rate: 137  Rhythm: sinus tachycardia  QRS Axis: normal  Intervals: normal  ST/T Wave abnormalities: nonspecific ST/T changes  Conduction Disutrbances:none  Narrative Interpretation:   Old EKG Reviewed: unchanged    Glynn Octave, MD 04/17/12 [phone]

## 2012-04-17 NOTE — Progress Notes (Signed)
INITIAL NUTRITION ASSESSMENT  DOCUMENTATION CODES Per approved criteria  -Not Applicable   INTERVENTION: 1. Continue Ensure Complete BID. Each supplement provides 350 kcal and 13 grams of protein.   2. Magic cup with meals. Each supplement provides 290 kcal and 9 grams of protein.  3. Please provide assistance with meals as pt cannot feed herself.   NUTRITION DIAGNOSIS: Inadequate oral intake related to decreased appetite as evidenced by 0% meal completion.   Goal: Pt to meet >/= 90% of estimated needs   Monitor:  PO intake, weight trends   Reason for Assessment: Health history   79 y.o. female  Admitting Dx: Dehydration  ASSESSMENT: Pt is 79 yo female with PMH of alzheimer's disease, CHF, HYT. Pt is from nursing home and presents to ED with a fall. Pt rolled out of bed. Of note, pt is completely aphasic from advanced dementia.   Work up suggested hyperglycemia and hypernatremia. Pt admitted for rehydration.   Dietetic Intern spoke with pt's son. Son reports pt's usual body weight over the years has remained around 120 lbs.  At times she has weighed a little more but never more than 130 lbs. Son feels pt may have had some weight loss but is unable to quantify. Son does not feel this loss is drastic. Currently, pt is at her usual body weight.   Son does report a gradual decrease in appetite. He also reports she has become less active recently and is now mainly in a wheelchair.   Pt spoke with RN. Pt currently on heart healthy diet but will be transitioned to NPO until a swallow evaluation is performed. Per observation, pt's breakfast tray is untouched. Pt currently already ordered Ensure BID. Per son, pt likes ice cream and son thinks pt would enjoy magic cup supplement. Dietetic Intern to order magic cup with meals to supplement her intake regardless of diet order.   Also of note, pt cannot feed herself and requires assistance with meals.    Height: Ht Readings from Last 1  Encounters:  04/17/12 5\' 3"  (1.6 m)    Weight: Wt Readings from Last 1 Encounters:  04/17/12 119 lb 7.8 oz (54.2 kg)    Ideal Body Weight: 115 lbs   % Ideal Body Weight: 103%  Wt Readings from Last 10 Encounters:  04/17/12 119 lb 7.8 oz (54.2 kg)    Usual Body Weight: ~120 lbs   % Usual Body Weight: 100%  BMI:  Body mass index is 21.17 kg/(m^2). WNL   Estimated Nutritional Needs: Kcal: 1100-1300  Protein: 70-85 gm Fluid: > 1.5 L    Skin: pressure ulcer, abrasions on legs    Diet Order: Cardiac  EDUCATION NEEDS: -No education needs identified at this time   Intake/Output Summary (Last 24 hours) at 04/17/12 0926 Last data filed at 04/17/12 0553  Gross per 24 hour  Intake 261.25 ml  Output    800 ml  Net -538.75 ml    Last BM: 04/17/2012   Labs:   Recent Labs Lab 04/17/12 0040 04/17/12 0530  NA 149* 152*  K 3.7 3.2*  CL 111 117*  CO2  --  26  BUN 33* 29*  CREATININE 1.00 0.70  CALCIUM  --  8.4  GLUCOSE 399* 286*    CBG (last 3)   Recent Labs  04/17/12 0233 04/17/12 0437 04/17/12 0804  GLUCAP 306* 246* 262*    Scheduled Meds: . cefTRIAXone (ROCEPHIN)  IV  1 g Intravenous Q24H  . Chlorhexidine Gluconate Cloth  6 each Topical Q0600  . donepezil  10 mg Oral QHS  . enoxaparin (LOVENOX) injection  40 mg Subcutaneous Q24H  . escitalopram  20 mg Oral Daily  . feeding supplement  237 mL Oral BID WC  . Iloperidone  1 mg Oral BID  . insulin aspart  0-9 Units Subcutaneous TID WC  . lisinopril  5 mg Oral q morning - 10a  . memantine  10 mg Oral BID  . mupirocin ointment  1 application Nasal BID  . senna  1 tablet Oral BID  . simvastatin  20 mg Oral QPM    Continuous Infusions: . sodium chloride 75 mL/hr at 04/17/12 1610    Past Medical History  Diagnosis Date  . Alzheimers disease   . Vascular dementia   . Psychosis   . Hypertension   . CHF (congestive heart failure)     History reviewed. No pertinent past surgical history.  [name] [name]  Dietetic Intern Pager: [phone]  I agree with the above information. Jarold Motto MS, RD, LDN Pager: [phone] After-hours pager: [phone]

## 2012-04-17 NOTE — Progress Notes (Signed)
Inpatient Diabetes Program Recommendations  AACE/ADA: New Consensus Statement on Inpatient Glycemic Control (2013)  Target Ranges:  Prepandial:   less than 140 mg/dL      Peak postprandial:   less than 180 mg/dL (1-2 hours)      Critically ill patients:  140 - 180 mg/dL    Results for BILLI, BRIGHT (MRN [id number]) as of 04/17/2012 12:10  Ref. Range 04/16/2012 23:51 04/17/2012 02:33 04/17/2012 04:37 04/17/2012 08:04 04/17/2012 11:47  Glucose-Capillary Latest Range: 70-99 mg/dL 595 (H) 638 (H) 756 (H) 262 (H) 205 (H)    Noted patient admitted from SNF with fall.  Has history of advanced dementia from Alzheimer's.  Hyperglycemic on admission.  Noted Novolog Sensitive correction scale (SSI) started today at 3am.  Noted A1c ordered and in process.  Awaiting results.  If patient continues to have CBGs >180 mg/dl, may want to increase Novolog to Moderate scale.  May need basal insulin as well.   Will follow. Ambrose Finland RN, MSN, CDE Diabetes Coordinator Inpatient Diabetes Program [phone]

## 2012-04-17 NOTE — Progress Notes (Signed)
[facility] PROGRESS NOTE  [name] [name] UJW:[id number] DOB: [birth year] DOA: 04/16/2012 PCP: Jari Favre, MD  Assessment/Plan: Principal Problem:   Dehydration Active Problems:   Fall   UTI (lower urinary tract infection)   Dementia   CHF (congestive heart failure)   HTN (hypertension)    1. Fall: Patient presents from nursing home following a fall. a fall. Precise circumstances are unclear, but fortunately, she does not appear to have suffered any bony injuries. Etiology is multifactorial, secondary to hypernatremia, dehydration and infection(UTI). Managing as described below.  2. Hyperglycemia/DM: Patient has no known previous history of DM, but random blood glucose was 399 at presentation. HBA1C is pending. Managing with ivi fluids and SSI. Today, CBGs have improved to the 200s.   3. Dehydration: She was noted to be tachycardic in the ED. This was likely related to dehydration and volume depletion, due to osmotic diuresis from hyperglycemia. Improving with iv fluids. Antihypertensives and diuretic are on hold.   4. Hypernatremia: Sodium was 149 at presentation, due to dehydration in the setting of poor oral intake, hyperglycemia and diuretics. Managing as described above an with hypotonic fluids. Following Lytes.  5. UTI: U/A is positive for nitrites and leucocytes, with significant pyuria and bacteriuria. On iv rocephin, day# 2. Cultures are pending.  6. AMS/Dementia: Patient is somewhat lethargic. This is due to profound metabolic abnormalities and UTI, in the setting of dementia. Improvement is anticipated. Head CT scan s devoid of acute findings.    Code Status: DNR/DNI Family Communication:  Disposition Plan: To be determined.   Brief narrative: 79 year old woman resident of [facility] nursing home, with history of Vascular dentia/Alzheimer's disease, psychosis, HTN, CHF, brought to the ED, following a fall from the bed. Patient is completely aphasic from advanced  dementia and all the history has been obtained from ER chart and son. She was noted to have abrasions on her legs. Labs suggested hyperglycemia and hypernatremia. Admitted for further management.    Consultants:  N/A.   Procedures:  Head CT scan.  CXR.   Antibiotics:  N/A.   HPI/Subjective: Somnolent.   Objective: Vital signs in last 24 hours: Temp:  [97.3 F (36.3 C)-98.4 F (36.9 C)] 98.4 F (36.9 C) (03/28 0447) Pulse Rate:  [95-140] 99 (03/28 0447) Resp:  [12-24] 18 (03/28 0447) BP: (90-158)/(45-87) 127/52 mmHg (03/28 0447) SpO2:  [95 %-100 %] 99 % (03/28 0447) Weight:  [54.2 kg (119 lb 7.8 oz)] 54.2 kg (119 lb 7.8 oz) (03/28 0447) Weight change:  Last BM Date: 04/17/12  Intake/Output from previous day: 03/27 0701 - 03/28 0700 In: 261.3 [I.V.:211.3; IV Piggyback:50] Out: 800 [Urine:800]     Physical Exam: General: Somnolent, Comfortable, not communicative, not short of breath at rest.  HEENT:  No clinical pallor, no jaundice, no conjunctival injection or discharge. Clinically dehydrated.  NECK:  Supple, JVP not seen, no carotid bruits, no palpable lymphadenopathy, no palpable goiter. CHEST:  Clinically clear to auscultation, no wheezes, no crackles. HEART:  Sounds 1 and 2 heard, normal, regular, no murmurs. ABDOMEN:  Moderately obese, soft, non-tender, no palpable organomegaly, no palpable masses, normal bowel sounds. GENITALIA:  Not examined. LOWER EXTREMITIES:  No pitting edema, palpable peripheral pulses. MUSCULOSKELETAL SYSTEM:  Generalized osteoarthritic changes, otherwise, normal. CENTRAL NERVOUS SYSTEM:  Not formally examined, due to lack of cooperation, but moving all limbs.  Lab Results:  Recent Labs  04/17/12 0040  HGB 14.3  HCT 42.0    Recent Labs  04/17/12 0040 04/17/12 0530  NA 149* 152*  K 3.7 3.2*  CL 111 117*  CO2  --  26  GLUCOSE 399* 286*  BUN 33* 29*  CREATININE 1.00 0.70  CALCIUM  --  8.4   Recent Results (from the  past 240 hour(s))  MRSA PCR SCREENING     Status: Abnormal   Collection Time    04/17/12  5:01 AM      Result Value Range Status   MRSA by PCR POSITIVE (*) NEGATIVE Final   Comment:            The GeneXpert MRSA Assay (FDA     approved for NASAL specimens     only), is one component of a     comprehensive MRSA colonization     surveillance program. It is not     intended to diagnose MRSA     infection nor to guide or     monitor treatment for     MRSA infections.     RESULT CALLED TO, READ BACK BY AND VERIFIED WITH:     Royann Shivers AT 0645 3/28/14BY K BARR     Studies/Results: Ct Head Wo Contrast  04/17/2012  *RADIOLOGY REPORT*  Clinical Data: Altered mental status.  Fall from bed.  CT HEAD WITHOUT CONTRAST  Technique:  Contiguous axial images were obtained from the base of the skull through the vertex without contrast.  Comparison: None.  Findings: No mass lesion, mass effect, midline shift, hydrocephalus, hemorrhage.  No acute territorial cortical ischemia/infarct. Atrophy and chronic ischemic white matter disease is present.  Old left thalamic lacunar infarct.  Calvarium intact. No skull fracture.  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  IMPRESSION: Atrophy and chronic ischemic white matter disease without acute intracranial abnormality.   Original Report Authenticated By: Andreas Newport, M.D.    Dg Chest Portable 1 View  04/17/2012  *RADIOLOGY REPORT*  Clinical Data: Altered level of consciousness.  Fell.  PORTABLE CHEST - 1 VIEW  Comparison: 03/17/2012.  Findings: The cardiac silhouette, mediastinal and hilar contours are normal and stable.  There is tortuosity and calcification of the thoracic aorta.  The lungs are clear of acute process.  No pleural effusion.  The bony thorax is intact.  IMPRESSION: No acute cardiopulmonary findings.   Original Report Authenticated By: Rudie Meyer, M.D.     Medications: Scheduled Meds: . cefTRIAXone (ROCEPHIN)  IV  1 g  Intravenous Q24H  . Chlorhexidine Gluconate Cloth  6 each Topical Q0600  . donepezil  10 mg Oral QHS  . enoxaparin (LOVENOX) injection  40 mg Subcutaneous Q24H  . escitalopram  20 mg Oral Daily  . feeding supplement  237 mL Oral BID WC  . Iloperidone  1 mg Oral BID  . insulin aspart  0-9 Units Subcutaneous TID WC  . lisinopril  5 mg Oral q morning - 10a  . memantine  10 mg Oral BID  . mupirocin ointment  1 application Nasal BID  . senna  1 tablet Oral BID  . simvastatin  20 mg Oral QPM   Continuous Infusions: . sodium chloride 75 mL/hr at 04/17/12 0512   PRN Meds:.ondansetron (ZOFRAN) IV, traMADol    LOS: 1 day   Bronte Sabado,CHRISTOPHER  [facility] Pager [phone]. If 8PM-8AM, please contact night-coverage at [email or web address], password [facility] 04/17/2012, 8:15 AM  LOS: 1 day

## 2012-04-18 DIAGNOSIS — N390 Urinary tract infection, site not specified: Principal | ICD-10-CM

## 2012-04-18 DIAGNOSIS — R7309 Other abnormal glucose: Secondary | ICD-10-CM

## 2012-04-18 DIAGNOSIS — F0390 Unspecified dementia without behavioral disturbance: Secondary | ICD-10-CM

## 2012-04-18 LAB — GLUCOSE, CAPILLARY
Glucose-Capillary: 281 mg/dL — ABNORMAL HIGH (ref 70–99)
Glucose-Capillary: 264 mg/dL — ABNORMAL HIGH (ref 70–99)
Glucose-Capillary: 280 mg/dL — ABNORMAL HIGH (ref 70–99)
Glucose-Capillary: 298 mg/dL — ABNORMAL HIGH (ref 70–99)

## 2012-04-18 LAB — COMPREHENSIVE METABOLIC PANEL
Alkaline Phosphatase: 90 U/L (ref 39–117)
BUN: 17 mg/dL (ref 6–23)
Chloride: 111 mEq/L (ref 96–112)
GFR calc Af Amer: 90 mL/min — ABNORMAL LOW (ref >90)
GFR calc non Af Amer: 77 mL/min — ABNORMAL LOW (ref >90)
Glucose, Bld: 273 mg/dL — ABNORMAL HIGH (ref 70–99)
Potassium: 3.1 mEq/L — ABNORMAL LOW (ref 3.5–5.1)
Total Bilirubin: 0.4 mg/dL (ref 0.3–1.2)
Total Protein: 5.7 g/dL — ABNORMAL LOW (ref 6.0–8.3)

## 2012-04-18 LAB — URINE CULTURE

## 2012-04-18 LAB — CBC
HCT: 33.9 % — ABNORMAL LOW (ref 36.0–46.0)
Hemoglobin: 11.4 g/dL — ABNORMAL LOW (ref 12.0–15.0)
MCHC: 33.6 g/dL (ref 30.0–36.0)

## 2012-04-18 MED ORDER — INSULIN ASPART 100 UNIT/ML ~~LOC~~ SOLN
0.0000 [IU] | Freq: Every day | SUBCUTANEOUS | Status: DC
  Administered 2012-04-19: 3 [IU] via SUBCUTANEOUS
  Administered 2012-04-19: 2 [IU] via SUBCUTANEOUS
  Administered 2012-04-22: 3 [IU] via SUBCUTANEOUS

## 2012-04-18 MED ORDER — POTASSIUM CHLORIDE 10 MEQ/100ML IV SOLN
10.0000 meq | INTRAVENOUS | Status: AC
  Administered 2012-04-18 (×4): 10 meq via INTRAVENOUS
  Filled 2012-04-18 (×4): qty 100

## 2012-04-18 MED ORDER — STARCH (THICKENING) PO POWD
ORAL | Status: DC | PRN
Start: 2012-04-18 — End: 2012-04-18

## 2012-04-18 MED ORDER — RESOURCE THICKENUP CLEAR PO POWD
ORAL | Status: DC | PRN
  Filled 2012-04-18: qty 125

## 2012-04-18 MED ORDER — LISINOPRIL 10 MG PO TABS
10.0000 mg | ORAL_TABLET | Freq: Every morning | ORAL | Status: DC
  Administered 2012-04-19 – 2012-04-23 (×5): 10 mg via ORAL
  Filled 2012-04-18 (×7): qty 1

## 2012-04-18 MED ORDER — INSULIN ASPART 100 UNIT/ML ~~LOC~~ SOLN
0.0000 [IU] | Freq: Three times a day (TID) | SUBCUTANEOUS | Status: DC
  Administered 2012-04-19: 3 [IU] via SUBCUTANEOUS
  Administered 2012-04-19: 8 [IU] via SUBCUTANEOUS
  Administered 2012-04-19: 11 [IU] via SUBCUTANEOUS
  Administered 2012-04-20: 3 [IU] via SUBCUTANEOUS
  Administered 2012-04-20: 100 [IU] via SUBCUTANEOUS
  Administered 2012-04-20: 8 [IU] via SUBCUTANEOUS
  Administered 2012-04-21 – 2012-04-22 (×2): 5 [IU] via SUBCUTANEOUS
  Administered 2012-04-22: 3 [IU] via SUBCUTANEOUS
  Administered 2012-04-22: 5 [IU] via SUBCUTANEOUS
  Administered 2012-04-23: 8 [IU] via SUBCUTANEOUS
  Administered 2012-04-23: 3 [IU] via SUBCUTANEOUS

## 2012-04-18 NOTE — Evaluation (Signed)
Clinical/Bedside Swallow Evaluation Patient Details  Name: [name] [name] MRN: [id number] Date of Birth: Nov 20, 1921  Today's Date: 04/18/2012 Time: 1055-1110 SLP Time Calculation (min): 15 min  Past Medical History:  Past Medical History  Diagnosis Date  . Alzheimers disease   . Vascular dementia   . Psychosis   . Hypertension   . CHF (congestive heart failure)    Past Surgical History: History reviewed. No pertinent past surgical history. HPI:  79 year old woman with past medical history significant for alzheimer's disease , resident of [facility] presents to the ED with a fall from the bed. Patient is completely aphasic from advanced dementia and all the history has been obtained from ER chart and son   Assessment / Plan / Recommendation Clinical Impression  Pt. somewhat more alert than yesterday and appears uncomfortable but not in pain.  Pt. with significant amounts of dried coating on tongue removed majority with intensive oral care.  Pt. with eyes closed but awake and intermittently responding in one word utterances.  Pharyngeal phase characterized by suspected delayed swallow initiation, decreased hyolaryngeal elevation without cough or throat clear.  Oral phase appeared functional for puree texture.  She is at higher aspiration risk due to decreased awareness and generalized weakness.  CXR yesterday without acute abnormalities.  SLP recommends initiating Dys 1 diet, nectar consistency when pt. alert/aware.  Administer liquid via spoon only and needs aggressive oral care, crush meds.  ST will continue to follow for tolerance with diet versus ability/need to participate in objective assessment.    Aspiration Risk  Moderate    Diet Recommendation Dysphagia 1 (Puree);Nectar-thick liquid   Liquid Administration via: Spoon Medication Administration: Crushed with puree Supervision: Full supervision/cueing for compensatory strategies;Staff feed patient Compensations:  Slow rate;Small sips/bites Postural Changes and/or Swallow Maneuvers: Seated upright 90 degrees    Other  Recommendations Oral Care Recommendations: Oral care BID   Follow Up Recommendations   (to be determined)    Frequency and Duration min 2x/week  2 weeks   Pertinent Vitals/Pain none    SLP Swallow Goals Patient will utilize recommended strategies during swallow to increase swallowing safety with: Maximal cueing   Swallow Study Prior Functional Status       General HPI: 79 year old woman with past medical history significant for alzheimer's disease , resident of [facility] presents to the ED with a fall from the bed. Patient is completely aphasic from advanced dementia and all the history has been obtained from ER chart and son Type of Study: Bedside swallow evaluation Previous Swallow Assessment: none Diet Prior to this Study: Dysphagia 3 (soft);Thin liquids (RN's keeping NPO) Temperature Spikes Noted: No Respiratory Status: Room air History of Recent Intubation: No Behavior/Cognition: Lethargic;Confused;Requires cueing;Decreased sustained attention Oral Cavity - Dentition: Poor condition;Missing dentition (se) Self-Feeding Abilities: Total assist Patient Positioning: Upright in bed Baseline Vocal Quality: Low vocal intensity;Clear Volitional Cough: Cognitively unable to elicit Volitional Swallow: Unable to elicit    Oral/Motor/Sensory Function Overall Oral Motor/Sensory Function:  (generalized weakness)   Ice Chips Ice chips: Impaired Presentation: Spoon Pharyngeal Phase Impairments: Decreased hyoid-laryngeal movement;Suspected delayed Swallow   Thin Liquid Thin Liquid: Impaired Presentation: Spoon Pharyngeal  Phase Impairments: Suspected delayed Swallow;Decreased hyoid-laryngeal movement    Nectar Thick Nectar Thick Liquid: Not tested   Honey Thick Honey Thick Liquid: Not tested   Puree Puree: Impaired Pharyngeal Phase Impairments: Suspected  delayed Swallow;Decreased hyoid-laryngeal movement   Solid       Solid:  Not tested       Breck Coons Zaleigh Bermingham M.Ed [employer] [phone]  04/18/2012

## 2012-04-18 NOTE — Progress Notes (Signed)
[id number] Pt's life alert and ring shape like a snake placed in denture cup is in pt's drawer. Son called to let him get it when he comes to visit.

## 2012-04-18 NOTE — Progress Notes (Signed)
[facility] PROGRESS NOTE  [name] [name] JYN:[id number] DOB: 08-16-1921 DOA: 04/16/2012 PCP: Jari Favre, MD  Assessment/Plan: Principal Problem:   Dehydration Active Problems:   Fall   UTI (lower urinary tract infection)   Dementia   CHF (congestive heart failure)   HTN (hypertension)    1. Fall: Patient presents from nursing home following a fall. a fall. Precise circumstances are unclear, but fortunately, she does not appear to have suffered any bony injuries. Etiology is multifactorial, secondary to hypernatremia, dehydration and infection(UTI). Managing as described below.  2. Hyperglycemia/DM: Patient has no known previous history of DM, but random blood glucose was 399 at presentation. HBA1C is 8.8. Managing with ivi fluids and SSI. Today, CBGs have improved. Adjusting SSI.    3. Dehydration: She was noted to be tachycardic in the ED. This was likely related to dehydration and volume depletion, due to osmotic diuresis from hyperglycemia. Improving with iv fluids. Antihypertensives and diuretic are on hold.   4. Hypernatremia: Sodium was 149 at presentation, due to dehydration in the setting of poor oral intake, hyperglycemia and diuretics. Managing as described above an with hypotonic fluids. Following Lytes. Gradually improving.  5. UTI: U/A is positive for nitrites and leucocytes, with significant pyuria and bacteriuria. On iv Rocephin, day# 2. Cultures are pending.  6. AMS/Dementia: Patient is somewhat lethargic. This is due to profound metabolic abnormalities and UTI, in the setting of dementia. Head CT scan s devoid of acute findings. Mental status is gradually improving. Patient was too lethargic to eat on 04/17/12, but today, she is more alert, has been evaluated by SLP and recommended D1/Nectar thick.    Code Status: DNR/DNI Family Communication:  Disposition Plan: To be determined.   Brief narrative: 79 year old woman resident of [facility] nursing home, with  history of Vascular dentia/Alzheimer's disease, psychosis, HTN, CHF, brought to the ED, following a fall from the bed. Patient is completely aphasic from advanced dementia and all the history has been obtained from ER chart and son. She was noted to have abrasions on her legs. Labs suggested hyperglycemia and hypernatremia. Admitted for further management.    Consultants:  N/A.   Procedures:  Head CT scan.  CXR.   Antibiotics:  N/A.   HPI/Subjective: More alert today and following simple commands.   Objective: Vital signs in last 24 hours: Temp:  [97.2 F (36.2 C)-99.9 F (37.7 C)] 99.2 F (37.3 C) (03/29 0930) Pulse Rate:  [86-132] 100 (03/29 0930) Resp:  [18-21] 20 (03/29 0930) BP: (112-188)/(69-90) 188/73 mmHg (03/29 0930) SpO2:  [96 %-100 %] 100 % (03/29 0930) Weight:  [53.933 kg (118 lb 14.4 oz)] 53.933 kg (118 lb 14.4 oz) (03/28 2136) Weight change: -0.267 kg (-9.4 oz) Last BM Date: 04/17/12  Intake/Output from previous day: 03/28 0701 - 03/29 0700 In: 1858.8 [I.V.:1808.8; IV Piggyback:50] Out: 1429 [Urine:1425; Stool:4]     Physical Exam: General: Somnolent, Comfortable, not communicative, not short of breath at rest.  HEENT:  No clinical pallor, no jaundice, no conjunctival injection or discharge. Hydration is improved.  NECK:  Supple, JVP not seen, no carotid bruits, no palpable lymphadenopathy, no palpable goiter. CHEST:  Clinically clear to auscultation, no wheezes, no crackles. HEART:  Sounds 1 and 2 heard, normal, regular, no murmurs. ABDOMEN:  Moderately obese, soft, non-tender, no palpable organomegaly, no palpable masses, normal bowel sounds. GENITALIA:  Not examined. LOWER EXTREMITIES:  No pitting edema, palpable peripheral pulses. MUSCULOSKELETAL SYSTEM:  Generalized osteoarthritic changes, otherwise, normal. CENTRAL NERVOUS SYSTEM:  Not formally examined, due to lack of cooperation, but moving all limbs.  Lab Results:  Recent Labs   04/17/12 0040 04/18/12 0500  WBC  --  15.1*  HGB 14.3 11.4*  HCT 42.0 33.9*  PLT  --  288    Recent Labs  04/17/12 0530 04/18/12 0500  NA 152* 146*  K 3.2* 3.1*  CL 117* 111  CO2 26 25  GLUCOSE 286* 273*  BUN 29* 17  CREATININE 0.70 0.64  CALCIUM 8.4 8.4   Recent Results (from the past 240 hour(s))  CULTURE, BLOOD (ROUTINE X 2)     Status: None   Collection Time    04/17/12 12:20 AM      Result Value Range Status   Specimen Description BLOOD LEFT HAND   Final   Special Requests BOTTLES DRAWN AEROBIC ONLY 5CC   Final   Culture  Setup Time 04/17/2012 04:33   Final   Culture     Final   Value:        BLOOD CULTURE RECEIVED NO GROWTH TO DATE CULTURE WILL BE HELD FOR 5 DAYS BEFORE ISSUING A FINAL NEGATIVE REPORT   Report Status PENDING   Incomplete  CULTURE, BLOOD (ROUTINE X 2)     Status: None   Collection Time    04/17/12  3:05 AM      Result Value Range Status   Specimen Description BLOOD RIGHT HAND   Final   Special Requests BOTTLES DRAWN AEROBIC ONLY 5CC   Final   Culture  Setup Time 04/17/2012 08:41   Final   Culture     Final   Value:        BLOOD CULTURE RECEIVED NO GROWTH TO DATE CULTURE WILL BE HELD FOR 5 DAYS BEFORE ISSUING A FINAL NEGATIVE REPORT   Report Status PENDING   Incomplete  MRSA PCR SCREENING     Status: Abnormal   Collection Time    04/17/12  5:01 AM      Result Value Range Status   MRSA by PCR POSITIVE (*) NEGATIVE Final   Comment:            The GeneXpert MRSA Assay (FDA     approved for NASAL specimens     only), is one component of a     comprehensive MRSA colonization     surveillance program. It is not     intended to diagnose MRSA     infection nor to guide or     monitor treatment for     MRSA infections.     RESULT CALLED TO, READ BACK BY AND VERIFIED WITH:     Royann Shivers AT 0645 3/28/14BY K BARR     Studies/Results: Ct Head Wo Contrast  04/17/2012  *RADIOLOGY REPORT*  Clinical Data: Altered mental status.  Fall from bed.   CT HEAD WITHOUT CONTRAST  Technique:  Contiguous axial images were obtained from the base of the skull through the vertex without contrast.  Comparison: None.  Findings: No mass lesion, mass effect, midline shift, hydrocephalus, hemorrhage.  No acute territorial cortical ischemia/infarct. Atrophy and chronic ischemic white matter disease is present.  Old left thalamic lacunar infarct.  Calvarium intact. No skull fracture.  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  IMPRESSION: Atrophy and chronic ischemic white matter disease without acute intracranial abnormality.   Original Report Authenticated By: Andreas Newport, M.D.    Dg Chest Portable 1 View  04/17/2012  *RADIOLOGY REPORT*  Clinical Data: Altered level of  consciousness.  Fell.  PORTABLE CHEST - 1 VIEW  Comparison: 03/17/2012.  Findings: The cardiac silhouette, mediastinal and hilar contours are normal and stable.  There is tortuosity and calcification of the thoracic aorta.  The lungs are clear of acute process.  No pleural effusion.  The bony thorax is intact.  IMPRESSION: No acute cardiopulmonary findings.   Original Report Authenticated By: Rudie Meyer, M.D.     Medications: Scheduled Meds: . cefTRIAXone (ROCEPHIN)  IV  1 g Intravenous Q24H  . Chlorhexidine Gluconate Cloth  6 each Topical Q0600  . donepezil  10 mg Oral QHS  . enoxaparin (LOVENOX) injection  40 mg Subcutaneous Q24H  . escitalopram  20 mg Oral Daily  . feeding supplement  237 mL Oral BID WC  . Iloperidone  1 mg Oral BID  . insulin aspart  0-9 Units Subcutaneous TID WC  . lisinopril  5 mg Oral q morning - 10a  . memantine  10 mg Oral BID  . mupirocin ointment  1 application Nasal BID  . potassium chloride  10 mEq Intravenous Q1 Hr x 4  . senna  1 tablet Oral BID  . simvastatin  20 mg Oral QPM   Continuous Infusions: . dextrose 75 mL/hr at 04/18/12 1157   PRN Meds:.acetaminophen, RESOURCE THICKENUP CLEAR, traMADol    LOS: 2 days    Jayleigh Notarianni,CHRISTOPHER  [facility] Pager [phone]. If 8PM-8AM, please contact night-coverage at [email or web address], password [facility] 04/18/2012, 1:05 PM  LOS: 2 days

## 2012-04-19 DIAGNOSIS — I10 Essential (primary) hypertension: Secondary | ICD-10-CM

## 2012-04-19 LAB — CBC
HCT: 33.6 % — ABNORMAL LOW (ref 36.0–46.0)
MCH: 29.9 pg (ref 26.0–34.0)
MCHC: 32.7 g/dL (ref 30.0–36.0)
MCV: 91.3 fL (ref 78.0–100.0)
RDW: 13.8 % (ref 11.5–15.5)

## 2012-04-19 LAB — BASIC METABOLIC PANEL
BUN: 14 mg/dL (ref 6–23)
CO2: 27 mEq/L (ref 19–32)
Chloride: 103 mEq/L (ref 96–112)
Creatinine, Ser: 0.53 mg/dL (ref 0.50–1.10)

## 2012-04-19 LAB — GLUCOSE, CAPILLARY
Glucose-Capillary: 238 mg/dL — ABNORMAL HIGH (ref 70–99)
Glucose-Capillary: 297 mg/dL — ABNORMAL HIGH (ref 70–99)
Glucose-Capillary: 250 mg/dL — ABNORMAL HIGH (ref 70–99)

## 2012-04-19 MED ORDER — AMLODIPINE BESYLATE 5 MG PO TABS
5.0000 mg | ORAL_TABLET | Freq: Every day | ORAL | Status: DC
  Administered 2012-04-19 – 2012-04-20 (×2): 5 mg via ORAL
  Filled 2012-04-19 (×2): qty 1

## 2012-04-19 MED ORDER — INSULIN GLARGINE 100 UNIT/ML ~~LOC~~ SOLN
10.0000 [IU] | Freq: Every day | SUBCUTANEOUS | Status: DC
  Administered 2012-04-19: 10 [IU] via SUBCUTANEOUS
  Filled 2012-04-19 (×2): qty 0.1

## 2012-04-19 MED ORDER — SODIUM CHLORIDE 0.45 % IV SOLN
INTRAVENOUS | Status: DC
  Administered 2012-04-19 – 2012-04-21 (×3): via INTRAVENOUS

## 2012-04-19 NOTE — Progress Notes (Signed)
Pt noted with blood streaked stool. New finding. Almost a red clay color. R. Reidler paged with call back. Will continue to monitor for signs of any frank red blood at this time. Dondra Spry

## 2012-04-19 NOTE — Clinical Social Work Psychosocial (Signed)
Clinical Social Work Department BRIEF PSYCHOSOCIAL ASSESSMENT 04/19/2012  Patient:  [name] [name], [name] [name]     Account Number:  [id number]     Admit date:  04/16/2012  Clinical Social Worker:  Oswaldo Done  Date/Time:  04/19/2012 10:58 AM  Referred by:  RN  Date Referred:  04/17/2012 Referred for  ALF Placement   Other Referral:   Interview type:   Other interview type:    PSYCHOSOCIAL DATA Living Status:  FACILITY Admitted from facility:  [facility] Level of care:  Assisted Living Primary support name:  Pasty Spillers Primary support relationship to patient:  CHILD, ADULT Degree of support available:   Adequate    CURRENT CONCERNS Current Concerns  Post-Acute Placement   Other Concerns:    SOCIAL WORK ASSESSMENT / PLAN CSW contacted patient's son Chirstopher Prunty by telephone. States patient has been living at [facility] for last 8-9 months.States patient has [facility] visiting patient at facility. Son stating he wishes for patient to return to [facility] upon discharge via ambulance. No other needs or concerns at this time.   Assessment/plan status:  Information/Referral to [facility] Other assessment/ plan:   Information/referral to community resources:   Ambulance    PATIENT'S/FAMILY'S RESPONSE TO PLAN OF CARE: Patient's son thanked CSW for assisting with patient return to [facility] upon discharge.        Ricke Hey, [place] [phone] (weekend)

## 2012-04-19 NOTE — Progress Notes (Signed)
[facility] PROGRESS NOTE  [name] [name] YNW:[id number] DOB: Jun 03, 1921 DOA: 04/16/2012 PCP: Jari Favre, MD  Assessment/Plan: Principal Problem:   Dehydration Active Problems:   Fall   UTI (lower urinary tract infection)   Dementia   CHF (congestive heart failure)   HTN (hypertension)    1. Fall: Patient presented from nursing home, following a fall. Precise circumstances are unclear, but fortunately, she does not appear to have suffered any bony injuries. Etiology is multifactorial, secondary to hypernatremia, dehydration and infection(UTI). Managing as described below.  2. Hyperglycemia/DM: Patient has no known previous history of DM, but random blood glucose was 399 at presentation. HBA1C is 8.8. Managing with ivi fluids and SSI. CBGs have improved. Adjusting SSI. Will commence Lantus at bedtime today.   3. Dehydration: She was noted to be tachycardic in the ED. This was likely related to dehydration and volume depletion, due to osmotic diuresis from hyperglycemia. Hydration status has improved. Diuretic is on hold.   4. Hypernatremia: Sodium was 149 at presentation, due to dehydration in the setting of poor oral intake, hyperglycemia and diuretics. Managing as described above, with hypotonic fluids. Today, sodium is 138, and hypernatremia has resolved. Have changed iv fluids to 0.45% saline today. Following Lytes.  5. E. Coli UTI: U/A was positive for nitrites and leucocytes, with significant pyuria and bacteriuria. On iv Rocephin, day# 3. Cultures grew pan-sensitive E. Coli.  6. AMS/Dementia: Patient was somewhat lethargic at presentation. This was due to profound metabolic abnormalities and UTI, in the setting of dementia. Head CT scan is devoid of acute findings. Patient was too lethargic to eat on 04/17/12, but on 04/18/12, she was more alert, has been evaluated by SLP and recommended D1/Nectar thick. Mental status continues to improve.  7. HTN: Pre-admission antihypertensives were  initially held, due to volume depletion and borderline BP. With improvement in hydration status, BP has started creeping up. Have resumed Lisinopril in increased dose of 10 mg daily, and have added Amlodipine.    Code Status: DNR/DNI Family Communication:  Disposition Plan: To be determined.   Brief narrative: 79 year old woman resident of [facility] nursing home, with history of Vascular dentia/Alzheimer's disease, psychosis, HTN, CHF, brought to the ED, following a fall from the bed. Patient is completely aphasic from advanced dementia and all the history has been obtained from ER chart and son. She was noted to have abrasions on her legs. Labs suggested hyperglycemia and hypernatremia. Admitted for further management.    Consultants:  N/A.   Procedures:  Head CT scan.  CXR.   Antibiotics:  N/A.   HPI/Subjective: Alert, eating, no new issues.   Objective: Vital signs in last 24 hours: Temp:  [99 F (37.2 C)-100.1 F (37.8 C)] 99 F (37.2 C) (03/29 2214) Pulse Rate:  [102-118] 106 (03/29 2214) Resp:  [18-20] 20 (03/29 2214) BP: (154-174)/(56-67) 174/64 mmHg (03/29 2214) SpO2:  [95 %-100 %] 98 % (03/29 2214) Weight change:  Last BM Date: 04/19/12  Intake/Output from previous day: 03/29 0701 - 03/30 0700 In: 1516.3 [P.O.:260; I.V.:856.3; IV Piggyback:400] Out: 300 [Urine:300]     Physical Exam: General: Comfortable, alert, not communicative, not short of breath at rest.  HEENT:  No clinical pallor, no jaundice, no conjunctival injection or discharge. Hydration is satisfactory.  NECK:  Supple, JVP not seen, no carotid bruits, no palpable lymphadenopathy, no palpable goiter. CHEST:  Clinically clear to auscultation, no wheezes, no crackles. HEART:  Sounds 1 and 2 heard, normal, regular, no murmurs. ABDOMEN:  Moderately obese, soft, non-tender, no palpable organomegaly, no palpable masses, normal bowel sounds. GENITALIA:  Not examined. LOWER EXTREMITIES:  No  pitting edema, palpable peripheral pulses. MUSCULOSKELETAL SYSTEM:  Generalized osteoarthritic changes, otherwise, normal. CENTRAL NERVOUS SYSTEM:  Not formally examined, due to lack of cooperation, but moving all limbs.  Lab Results:  Recent Labs  04/18/12 0500 04/19/12 0920  WBC 15.1* 14.6*  HGB 11.4* 11.0*  HCT 33.9* 33.6*  PLT 288 250    Recent Labs  04/18/12 0500 04/19/12 0920  NA 146* 138  K 3.1* 3.5  CL 111 103  CO2 25 27  GLUCOSE 273* 315*  BUN 17 14  CREATININE 0.64 0.53  CALCIUM 8.4 8.1*   Recent Results (from the past 240 hour(s))  CULTURE, BLOOD (ROUTINE X 2)     Status: None   Collection Time    04/17/12 12:20 AM      Result Value Range Status   Specimen Description BLOOD LEFT HAND   Final   Special Requests BOTTLES DRAWN AEROBIC ONLY 5CC   Final   Culture  Setup Time 04/17/2012 04:33   Final   Culture     Final   Value:        BLOOD CULTURE RECEIVED NO GROWTH TO DATE CULTURE WILL BE HELD FOR 5 DAYS BEFORE ISSUING A FINAL NEGATIVE REPORT   Report Status PENDING   Incomplete  URINE CULTURE     Status: None   Collection Time    04/17/12  1:11 AM      Result Value Range Status   Specimen Description URINE, RANDOM   Final   Special Requests NONE   Final   Culture  Setup Time 04/17/2012 05:55   Final   Colony Count >=100,000 COLONIES/ML   Final   Culture ESCHERICHIA COLI   Final   Report Status 04/18/2012 FINAL   Final   Organism ID, Bacteria ESCHERICHIA COLI   Final  CULTURE, BLOOD (ROUTINE X 2)     Status: None   Collection Time    04/17/12  3:05 AM      Result Value Range Status   Specimen Description BLOOD RIGHT HAND   Final   Special Requests BOTTLES DRAWN AEROBIC ONLY 5CC   Final   Culture  Setup Time 04/17/2012 08:41   Final   Culture     Final   Value:        BLOOD CULTURE RECEIVED NO GROWTH TO DATE CULTURE WILL BE HELD FOR 5 DAYS BEFORE ISSUING A FINAL NEGATIVE REPORT   Report Status PENDING   Incomplete  MRSA PCR SCREENING     Status:  Abnormal   Collection Time    04/17/12  5:01 AM      Result Value Range Status   MRSA by PCR POSITIVE (*) NEGATIVE Final   Comment:            The GeneXpert MRSA Assay (FDA     approved for NASAL specimens     only), is one component of a     comprehensive MRSA colonization     surveillance program. It is not     intended to diagnose MRSA     infection nor to guide or     monitor treatment for     MRSA infections.     RESULT CALLED TO, READ BACK BY AND VERIFIED WITH:     N NYLANDER,RN AT 0645 3/28/14BY K BARR     Studies/Results: No results found.  Medications:  Scheduled Meds: . cefTRIAXone (ROCEPHIN)  IV  1 g Intravenous Q24H  . Chlorhexidine Gluconate Cloth  6 each Topical Q0600  . donepezil  10 mg Oral QHS  . enoxaparin (LOVENOX) injection  40 mg Subcutaneous Q24H  . escitalopram  20 mg Oral Daily  . feeding supplement  237 mL Oral BID WC  . Iloperidone  1 mg Oral BID  . insulin aspart  0-15 Units Subcutaneous TID WC  . insulin aspart  0-5 Units Subcutaneous QHS  . lisinopril  10 mg Oral q morning - 10a  . memantine  10 mg Oral BID  . mupirocin ointment  1 application Nasal BID  . senna  1 tablet Oral BID  . simvastatin  20 mg Oral QPM   Continuous Infusions: . sodium chloride     PRN Meds:.acetaminophen, RESOURCE THICKENUP CLEAR, traMADol    LOS: 3 days   Kjell Brannen,CHRISTOPHER  [facility] Pager [phone]. If 8PM-8AM, please contact night-coverage at [email or web address], password [facility] 04/19/2012, 11:30 AM  LOS: 3 days

## 2012-04-20 LAB — BASIC METABOLIC PANEL
Calcium: 8.3 mg/dL — ABNORMAL LOW (ref 8.4–10.5)
Creatinine, Ser: 0.49 mg/dL — ABNORMAL LOW (ref 0.50–1.10)
GFR calc Af Amer: >90 mL/min (ref >90)
GFR calc non Af Amer: 84 mL/min — ABNORMAL LOW (ref >90)
Sodium: 138 mEq/L (ref 135–145)

## 2012-04-20 LAB — GLUCOSE, CAPILLARY
Glucose-Capillary: 194 mg/dL — ABNORMAL HIGH (ref 70–99)
Glucose-Capillary: 221 mg/dL — ABNORMAL HIGH (ref 70–99)

## 2012-04-20 LAB — CBC
MCH: 29.8 pg (ref 26.0–34.0)
MCV: 89 fL (ref 78.0–100.0)
Platelets: 251 10*3/uL (ref 150–400)
RBC: 3.73 MIL/uL — ABNORMAL LOW (ref 3.87–5.11)
RDW: 13.6 % (ref 11.5–15.5)

## 2012-04-20 MED ORDER — DEXTROSE 50 % IV SOLN: 25.0000 mL | Freq: Once | INTRAVENOUS | Status: AC | PRN

## 2012-04-20 MED ORDER — INSULIN GLARGINE 100 UNIT/ML ~~LOC~~ SOLN
15.0000 [IU] | Freq: Every day | SUBCUTANEOUS | Status: DC
  Administered 2012-04-20 – 2012-04-22 (×3): 15 [IU] via SUBCUTANEOUS
  Filled 2012-04-20 (×4): qty 0.15

## 2012-04-20 MED ORDER — AMLODIPINE BESYLATE 10 MG PO TABS
10.0000 mg | ORAL_TABLET | Freq: Every day | ORAL | Status: DC
  Administered 2012-04-21 – 2012-04-23 (×3): 10 mg via ORAL
  Filled 2012-04-20 (×3): qty 1

## 2012-04-20 MED ORDER — DEXTROSE 50 % IV SOLN
INTRAVENOUS | Status: AC
  Administered 2012-04-20: 50 mL
  Filled 2012-04-20: qty 50

## 2012-04-20 MED ORDER — INSULIN ASPART 100 UNIT/ML ~~LOC~~ SOLN
4.0000 [IU] | Freq: Three times a day (TID) | SUBCUTANEOUS | Status: DC
  Administered 2012-04-21 – 2012-04-23 (×4): 4 [IU] via SUBCUTANEOUS

## 2012-04-20 NOTE — Progress Notes (Signed)
Inpatient Diabetes Program Recommendations  AACE/ADA: New Consensus Statement on Inpatient Glycemic Control (2013)  Target Ranges:  Prepandial:   less than 140 mg/dL      Peak postprandial:   less than 180 mg/dL (1-2 hours)      Critically ill patients:  140 - 180 mg/dL    Results for MERRYL, BUCKELS (MRN [id number]) as of 04/20/2012 13:47  Ref. Range 04/19/2012 07:55 04/19/2012 11:26 04/19/2012 16:34 04/19/2012 20:19  Glucose-Capillary Latest Range: 70-99 mg/dL 782 (H) 956 (H) 213 (H) 250 (H)   Results for KUMIKO, FISHMAN (MRN [id number]) as of 04/20/2012 13:47  Ref. Range 04/20/2012 08:02 04/20/2012 12:45  Glucose-Capillary Latest Range: 70-99 mg/dL 629 (H) 528 (H)    Inpatient Diabetes Program Recommendations Insulin - Basal: Please consider increasing Lantus to 15 units QHS. Insulin - Meal Coverage: Please consider adding meal coverage- Novolog 3 units tid with meals.  Will follow. Ambrose Finland RN, MSN, CDE Diabetes Coordinator Inpatient Diabetes Program [phone]

## 2012-04-20 NOTE — Progress Notes (Signed)
Pt BM at 0600 stills appears slightly blood streaked / meconium colored as before. Will report to oncoming shift for further monitoring of frank red blood as earlier requested per R. Reidler night coverage

## 2012-04-20 NOTE — Progress Notes (Signed)
[facility] PROGRESS NOTE  [name] [name] UVO:[id number] DOB: 1921/03/26 DOA: 04/16/2012 PCP: Jari Favre, MD  Assessment/Plan: Principal Problem:   Dehydration Active Problems:   Fall   UTI (lower urinary tract infection)   Dementia   CHF (congestive heart failure)   HTN (hypertension)    1. Fall: Patient presented from nursing home, following a fall. Precise circumstances are unclear, but fortunately, she does not appear to have suffered any bony injuries. Etiology is multifactorial, secondary to hypernatremia, dehydration and infection(UTI). Managing as described below.  2. Hyperglycemia/DM: Patient has no known previous history of DM, but random blood glucose was 399 at presentation. HBA1C is 8.8. Managing with ivi fluids and SSI. CBGs have improved. Adjusting SSI. Lantus commenced at bedtime on 04/18/12. Adjusting as indicated. .   3. Dehydration: She was noted to be tachycardic in the ED. This was likely related to dehydration and volume depletion, due to osmotic diuresis from hyperglycemia. Hydration status has improved. Diuretic has been discontinued.  4. Hypernatremia: Sodium was 149 at presentation, due to dehydration in the setting of poor oral intake, hyperglycemia and diuretics. Managing as described above, with hypotonic fluids. As of 04/18/12, sodium was 138, and hypernatremia had resolved. Changed iv fluids to 0.45% saline on that date. Following Lytes.  5. E. Coli UTI: U/A was positive for nitrites and leucocytes, with significant pyuria and bacteriuria. On iv Rocephin, day# 4. Cultures grew pan-sensitive E. Coli. Patient has remained afebrile.  6. AMS/Dementia: Patient was somewhat lethargic at presentation. This was due to profound metabolic abnormalities and UTI, in the setting of dementia. Head CT scan is devoid of acute findings. Patient was too lethargic to eat on 04/17/12, but on 04/18/12, she was more alert, has been evaluated by SLP and recommended D1/Nectar thick.  Mental status continues to improve.  7. HTN: Pre-admission antihypertensives were initially held, due to volume depletion and borderline BP. With improvement in hydration status, BP has started creeping up. Have resumed Lisinopril in increased dose of 10 mg daily, and have added Amlodipine.    Code Status: DNR/DNI Family Communication:  Disposition Plan: To be determined.   Brief narrative: 79 year old woman resident of [facility] nursing home, with history of Vascular dentia/Alzheimer's disease, psychosis, HTN, CHF, brought to the ED, following a fall from the bed. Patient is completely aphasic from advanced dementia and all the history has been obtained from ER chart and son. She was noted to have abrasions on her legs. Labs suggested hyperglycemia and hypernatremia. Admitted for further management.    Consultants:  N/A.   Procedures:  Head CT scan.  CXR.   Antibiotics:  N/A.   HPI/Subjective: No new issues.   Objective: Vital signs in last 24 hours: Temp:  [97.3 F (36.3 C)-99.6 F (37.6 C)] 97.3 F (36.3 C) (03/31 0947) Pulse Rate:  [84-103] 84 (03/31 0947) Resp:  [18-20] 20 (03/31 0947) BP: (159-187)/(54-75) 164/71 mmHg (03/31 0947) SpO2:  [96 %-100 %] 100 % (03/31 0947) Weight:  [57.5 kg (126 lb 12.2 oz)] 57.5 kg (126 lb 12.2 oz) (03/30 2032) Weight change:  Last BM Date: 04/19/12  Intake/Output from previous day: 03/30 0701 - 03/31 0700 In: 1060 [P.O.:480; I.V.:580] Out: 600 [Urine:600] Total I/O In: 240 [P.O.:240] Out: -    Physical Exam: General: Comfortable, alert, not communicative, not short of breath at rest.  HEENT:  No clinical pallor, no jaundice, no conjunctival injection or discharge. Hydration is satisfactory.  NECK:  Supple, JVP not seen, no carotid bruits,  no palpable lymphadenopathy, no palpable goiter. CHEST:  Clinically clear to auscultation, no wheezes, no crackles. HEART:  Sounds 1 and 2 heard, normal, regular, no  murmurs. ABDOMEN:  Moderately obese, soft, non-tender, no palpable organomegaly, no palpable masses, normal bowel sounds. GENITALIA:  Not examined. LOWER EXTREMITIES:  No pitting edema, palpable peripheral pulses. MUSCULOSKELETAL SYSTEM:  Generalized osteoarthritic changes, otherwise, normal. CENTRAL NERVOUS SYSTEM:  Not formally examined, due to lack of cooperation, but moving all limbs.  Lab Results:  Recent Labs  04/19/12 0920 04/20/12 0620  WBC 14.6* 17.0*  HGB 11.0* 11.1*  HCT 33.6* 33.2*  PLT 250 251    Recent Labs  04/19/12 0920 04/20/12 0620  NA 138 138  K 3.5 4.0  CL 103 104  CO2 27 29  GLUCOSE 315* 197*  BUN 14 13  CREATININE 0.53 0.49*  CALCIUM 8.1* 8.3*   Recent Results (from the past 240 hour(s))  CULTURE, BLOOD (ROUTINE X 2)     Status: None   Collection Time    04/17/12 12:20 AM      Result Value Range Status   Specimen Description BLOOD LEFT HAND   Final   Special Requests BOTTLES DRAWN AEROBIC ONLY 5CC   Final   Culture  Setup Time 04/17/2012 04:33   Final   Culture     Final   Value:        BLOOD CULTURE RECEIVED NO GROWTH TO DATE CULTURE WILL BE HELD FOR 5 DAYS BEFORE ISSUING A FINAL NEGATIVE REPORT   Report Status PENDING   Incomplete  URINE CULTURE     Status: None   Collection Time    04/17/12  1:11 AM      Result Value Range Status   Specimen Description URINE, RANDOM   Final   Special Requests NONE   Final   Culture  Setup Time 04/17/2012 05:55   Final   Colony Count >=100,000 COLONIES/ML   Final   Culture ESCHERICHIA COLI   Final   Report Status 04/18/2012 FINAL   Final   Organism ID, Bacteria ESCHERICHIA COLI   Final  CULTURE, BLOOD (ROUTINE X 2)     Status: None   Collection Time    04/17/12  3:05 AM      Result Value Range Status   Specimen Description BLOOD RIGHT HAND   Final   Special Requests BOTTLES DRAWN AEROBIC ONLY 5CC   Final   Culture  Setup Time 04/17/2012 08:41   Final   Culture     Final   Value:        BLOOD  CULTURE RECEIVED NO GROWTH TO DATE CULTURE WILL BE HELD FOR 5 DAYS BEFORE ISSUING A FINAL NEGATIVE REPORT   Report Status PENDING   Incomplete  MRSA PCR SCREENING     Status: Abnormal   Collection Time    04/17/12  5:01 AM      Result Value Range Status   MRSA by PCR POSITIVE (*) NEGATIVE Final   Comment:            The GeneXpert MRSA Assay (FDA     approved for NASAL specimens     only), is one component of a     comprehensive MRSA colonization     surveillance program. It is not     intended to diagnose MRSA     infection nor to guide or     monitor treatment for     MRSA infections.     RESULT  CALLED TO, READ BACK BY AND VERIFIED WITH:     N NYLANDER,RN AT 0645 3/28/14BY K BARR     Studies/Results: No results found.  Medications: Scheduled Meds: . amLODipine  5 mg Oral Daily  . cefTRIAXone (ROCEPHIN)  IV  1 g Intravenous Q24H  . Chlorhexidine Gluconate Cloth  6 each Topical Q0600  . donepezil  10 mg Oral QHS  . enoxaparin (LOVENOX) injection  40 mg Subcutaneous Q24H  . escitalopram  20 mg Oral Daily  . feeding supplement  237 mL Oral BID WC  . Iloperidone  1 mg Oral BID  . insulin aspart  0-15 Units Subcutaneous TID WC  . insulin aspart  0-5 Units Subcutaneous QHS  . insulin glargine  10 Units Subcutaneous QHS  . lisinopril  10 mg Oral q morning - 10a  . memantine  10 mg Oral BID  . mupirocin ointment  1 application Nasal BID  . senna  1 tablet Oral BID  . simvastatin  20 mg Oral QPM   Continuous Infusions: . sodium chloride 50 mL/hr at 04/19/12 1248   PRN Meds:.acetaminophen, RESOURCE THICKENUP CLEAR, traMADol    LOS: 4 days   Toree Edling,CHRISTOPHER  [facility] Pager [phone]. If 8PM-8AM, please contact night-coverage at [email or web address], password [facility] 04/20/2012, 12:00 PM  LOS: 4 days

## 2012-04-20 NOTE — Progress Notes (Signed)
Speech Language Pathology Dysphagia Treatment Patient Details Name: [name] [name] MRN: [id number] DOB: 12-07-1921 Today's Date: 04/20/2012 Time: 1015-1024 SLP Time Calculation (min): 9 min  Assessment / Plan / Recommendation Clinical Impression  Skilled dysphagia treatment session to assess tolerance and appropriateness/indication for instrumental evaluation.  Pt's awoke easily today to SLP verbal stimulation but unable to follow directions due to her aphasia/dementia.  Pt given nectar via tsp, cup, straw and yogurt - delayed swallow initiation noted with weak cough x1 only of approx 8 boluses.  No oral stasis and oral cavity was clean today.  Pt did become lethargic and closed her eyes after consuming only a few bites/sips.  Note pt with fevers over the weekend but today she is afebrile.   Intake listed as 75% yesterday!     Rec continue diet restrictions to mitigate her aspiration risk but it will not be elminiated due to her multiple risk factors including dysphagia, reliance on others for feeding, inability to follow commands and weakness.   If MD desires, MBS may add more to clinical picture of pt's dysphagia, please order if you agree.  SLP to follow up for dysphagia management.      Diet Recommendation  Continue with Current Diet: Nectar-thick liquid;Dysphagia 1 (puree)    SLP Plan Continue with current plan of care   Pertinent Vitals/Pain Afebrile today, decreased   Swallowing Goals  SLP Swallowing Goals Patient will utilize recommended strategies during swallow to increase swallowing safety with: Total assistance (modified goal due to pt's dementia, total assist for po)  General Temperature Spikes Noted: Yes (tempertures over the weekend) Respiratory Status: Room air Behavior/Cognition: Confused;Decreased sustained attention;Doesn't follow directions (pt has dementia) Oral Cavity - Dentition: Poor condition;Missing dentition Patient Positioning: Upright in bed  Oral Cavity -  Oral Hygiene   oral cavity clear  Dysphagia Treatment Treatment focused on: Skilled observation of diet tolerance;Upgraded PO texture trials;Patient/family/caregiver education Family/Caregiver Educated: pt Treatment Methods/Modalities: Skilled observation;Differential diagnosis Patient observed directly with PO's: Yes Type of PO's observed: Dysphagia 1 (puree);Nectar-thick liquids Feeding: Total assist Liquids provided via: Teaspoon;Cup;Straw Pharyngeal Phase Signs & Symptoms: Suspected delayed swallow initiation;Delayed cough (delayed cough x1, pt sounds mildly congested with breathing) Type of cueing: Verbal;Tactile;Visual Amount of cueing: Total   GO     Donavan Burnet, MS [facility] SLP [phone]

## 2012-04-20 NOTE — Progress Notes (Signed)
Hypoglycemic Event  CBG: 63  Treatment: D50 IV 50 mL  Symptoms: Sweaty  Follow-up CBG: Time:2150 CBG Result:221  Possible Reasons for Event: Inadequate meal intake  Comments/MD notified:na    [name] [name], Sharmon Leyden  Remember to initiate Hypoglycemia Order Set & complete

## 2012-04-21 LAB — CBC
HCT: 29.2 % — ABNORMAL LOW (ref 36.0–46.0)
Hemoglobin: 9.9 g/dL — ABNORMAL LOW (ref 12.0–15.0)
MCV: 86.9 fL (ref 78.0–100.0)
RBC: 3.36 MIL/uL — ABNORMAL LOW (ref 3.87–5.11)
RDW: 13.6 % (ref 11.5–15.5)
WBC: 14.9 10*3/uL — ABNORMAL HIGH (ref 4.0–10.5)

## 2012-04-21 LAB — BASIC METABOLIC PANEL
BUN: 18 mg/dL (ref 6–23)
CO2: 26 mEq/L (ref 19–32)
Chloride: 102 mEq/L (ref 96–112)
Creatinine, Ser: 0.63 mg/dL (ref 0.50–1.10)
GFR calc Af Amer: >90 mL/min (ref >90)
Glucose, Bld: 89 mg/dL (ref 70–99)
Potassium: 3.5 mEq/L (ref 3.5–5.1)

## 2012-04-21 LAB — GLUCOSE, CAPILLARY
Glucose-Capillary: 77 mg/dL (ref 70–99)
Glucose-Capillary: 237 mg/dL — ABNORMAL HIGH (ref 70–99)
Glucose-Capillary: 103 mg/dL — ABNORMAL HIGH (ref 70–99)
Glucose-Capillary: 187 mg/dL — ABNORMAL HIGH (ref 70–99)

## 2012-04-21 NOTE — Clinical Social Work Note (Signed)
CSW made contact with [facility] to advise of possible discharge on 4/2. Crystal, Wellness nurse at [facility] contacted CSW to inform that Pry[facility] Montes will come to evaluate the patient on Wednesday morning.   Fernande Boyden, Social Work Intern 04/21/2012  Co-Signed: Genelle Bal, LCSW

## 2012-04-21 NOTE — Progress Notes (Signed)
NUTRITION FOLLOW UP  DOCUMENTATION CODES  Per approved criteria   -Not Applicable    Intervention:   1. Continue Ensure Complete BID. Each supplement provides 350 kcal and 13 grams of protein.  2. Magic cup with meals. Each supplement provides 290 kcal and 9 grams of protein.  3. Please provide assistance with meals as pt cannot feed herself.  4. RD to continue to follow nutrition care plan  Nutrition Dx:   Inadequate oral intake related to decreased appetite as evidenced by 0% meal completion. Resolved.  New Nutrition Dx: Increased nutrient needs related to acute illness AEB estimated needs.  Goal:   Pt to meet >/= 90% of estimated needs  Monitor:   PO intake, weight trends   Assessment:   Pt is 79 yo female with PMH of alzheimer's disease, CHF, HYT. Pt is from nursing home and presents to ED with a fall. Pt rolled out of bed. Of note, pt is completely aphasic from advanced dementia. Work up suggested hyperglycemia and hypernatremia. Pt admitted for rehydration.   BSE completed 3/29 with recommendations for Dysphagia 1 diet with Nectar Liquids. RN confirms that pt is eating well, requires to be fed. Also, she is drinking her supplements as scheduled.   Height: Ht Readings from Last 1 Encounters:  04/20/12 5\' 3"  (1.6 m)    Weight Status:   Wt Readings from Last 1 Encounters:  04/20/12 123 lb 8 oz (56.019 kg)  Wt up 4 lb since admission.  Re-estimated needs:  Kcal: 1100 - 1300 Protein: 70 - 85 grams Fluid: at least 1.5 liters daily  Skin: pressure ulcer on buttocks  Diet Order: Dysphagia 1 with nectar thickened liquids   Intake/Output Summary (Last 24 hours) at 04/21/12 1143 Last data filed at 04/21/12 0900  Gross per 24 hour  Intake    360 ml  Output   1031 ml  Net   -671 ml    Last BM: 3/31   Labs:   Recent Labs Lab 04/18/12 0500 04/18/12 1227 04/19/12 0920 04/20/12 0620 04/21/12 0545  NA 146*  --  138 138 136  K 3.1*  --  3.5 4.0 3.5  CL 111   --  103 104 102  CO2 25  --  27 29 26   BUN 17  --  14 13 18   CREATININE 0.64  --  0.53 0.49* 0.63  CALCIUM 8.4  --  8.1* 8.3* 8.3*  MG  --  2.2 2.3  --   --   GLUCOSE 273*  --  315* 197* 89    CBG (last 3)   Recent Labs  04/20/12 2116 04/20/12 2150 04/21/12 0713  GLUCAP 63* 221* 77    Scheduled Meds: . amLODipine  10 mg Oral Daily  . cefTRIAXone (ROCEPHIN)  IV  1 g Intravenous Q24H  . donepezil  10 mg Oral QHS  . enoxaparin (LOVENOX) injection  40 mg Subcutaneous Q24H  . escitalopram  20 mg Oral Daily  . feeding supplement  237 mL Oral BID WC  . Iloperidone  1 mg Oral BID  . insulin aspart  0-15 Units Subcutaneous TID WC  . insulin aspart  0-5 Units Subcutaneous QHS  . insulin aspart  4 Units Subcutaneous TID WC  . insulin glargine  15 Units Subcutaneous QHS  . lisinopril  10 mg Oral q morning - 10a  . memantine  10 mg Oral BID  . mupirocin ointment  1 application Nasal BID  . senna  1 tablet  Oral BID  . simvastatin  20 mg Oral QPM    Continuous Infusions: . sodium chloride Stopped (04/21/12 1040)    Jarold Motto MS, RD, LDN Pager: [phone] After-hours pager: [phone]

## 2012-04-21 NOTE — Progress Notes (Addendum)
[facility] PROGRESS NOTE  [name] [name]:[id number] DOB: 1921/09/24 DOA: 04/16/2012 PCP: Jari Favre, MD  Assessment/Plan: Principal Problem:   Dehydration Active Problems:   Fall   UTI (lower urinary tract infection)   Dementia   CHF (congestive heart failure)   HTN (hypertension)    1. Fall: Patient presented from nursing home, following a fall. Precise circumstances are unclear, but fortunately, she does not appear to have suffered any bony injuries. Etiology is multifactorial, secondary to hypernatremia, dehydration and infection(UTI). Managing as described below.  2. Hyperglycemia/DM: Patient has no known previous history of DM, but random blood glucose was 399 at presentation. HBA1C is 8.8. Managing with ivi fluids and SSI. CBGs have improved. Adjusting SSI. Lantus commenced at bedtime on 04/18/12. Adjusting as indicated, with improved control. 3. Dehydration: She was noted to be tachycardic in the ED. This was likely related to dehydration and volume depletion, due to osmotic diuresis from hyperglycemia. Diuretic was of course discontinued, discontinued, and patient managed with iv fluids. As of 04/21/12, hydration status was satisfactory, and iv fluids were discontinued on that date.  4. Hypernatremia: Sodium was 149 at presentation, due to dehydration in the setting of poor oral intake, hyperglycemia and diuretics. Managing as described above, with hypotonic fluids. As of 04/18/12, sodium was 138, and hypernatremia had resolved. Changed iv fluids to 0.45% saline on that date. Following Lytes, and today, sodium is 136. As described in #3 above, iv fluids have been discontinued. .  5. E. Coli UTI: U/A was positive for nitrites and leucocytes, with significant pyuria and bacteriuria. On iv Rocephin, day# 5. Cultures grew pan-sensitive E. Coli. Patient has remained afebrile, and antibiotic therapy will be concluded on 04/23/12.   6. AMS/Dementia: Patient was somewhat lethargic at  presentation. This was due to profound metabolic abnormalities and UTI, in the setting of dementia. Head CT scan is devoid of acute findings. Patient was too lethargic to eat on 04/17/12, but on 04/18/12, she was more alert, has been evaluated by SLP and recommended D1/Nectar thick. Mental status continued to improve, and is now practically at baseline.  7. HTN: Pre-admission antihypertensives were initially held, due to volume depletion and borderline BP. With improvement in hydration status, BP has started creeping up. Have resumed Lisinopril in increased dose of 10 mg daily, and have added Amlodipine on 04/20/12, with reasonable control.  8. Diarrhea: Patient had a large bowel movement on 04/20/12, after a period of constipation, followed by diarrheal stool. C. Difficile PCR was negative. As of 04/21/12, diarrhea had resolved.    Code Status: DNR/DNI Family Communication:  Disposition Plan: To be determined. Nearing discharge.    Brief narrative: 79 year old woman resident of [facility] nursing home, with history of Vascular dentia/Alzheimer's disease, psychosis, HTN, CHF, brought to the ED, following a fall from the bed. Patient is completely aphasic from advanced dementia and all the history has been obtained from ER chart and son. She was noted to have abrasions on her legs. Labs suggested hyperglycemia and hypernatremia. Admitted for further management.    Consultants:  N/A.   Procedures:  Head CT scan.  CXR.   Antibiotics:  N/A.   HPI/Subjective: No new issues. Alert. Following simple commands.   Objective: Vital signs in last 24 hours: Temp:  [97 F (36.1 C)-98.3 F (36.8 C)] 97 F (36.1 C) (04/01 1430) Pulse Rate:  [75-82] 81 (04/01 1430) Resp:  [20] 20 (04/01 1430) BP: (98-161)/(51-80) 135/59 mmHg (04/01 1430) SpO2:  [99 %-100 %]  100 % (04/01 1430) Weight:  [56.019 kg (123 lb 8 oz)] 56.019 kg (123 lb 8 oz) (03/31 2040) Weight change: -1.481 kg (-3 lb 4.2 oz) Last  BM Date: 04/21/12  Intake/Output from previous day: 03/31 0701 - 04/01 0700 In: 360 [P.O.:360] Out: 1029 [Urine:1025; Stool:4] Total I/O In: 240 [P.O.:240] Out: 2 [Stool:2]   Physical Exam: General: Comfortable, alert, not communicative, not short of breath at rest.  HEENT:  No clinical pallor, no jaundice, no conjunctival injection or discharge. Hydration is satisfactory.  NECK:  Supple, JVP not seen, no carotid bruits, no palpable lymphadenopathy, no palpable goiter. CHEST:  Clinically clear to auscultation, no wheezes, no crackles. HEART:  Sounds 1 and 2 heard, normal, regular, no murmurs. ABDOMEN:  Moderately obese, soft, non-tender, no palpable organomegaly, no palpable masses, normal bowel sounds. GENITALIA:  Not examined. LOWER EXTREMITIES:  No pitting edema, palpable peripheral pulses. MUSCULOSKELETAL SYSTEM:  Generalized osteoarthritic changes, otherwise, normal. CENTRAL NERVOUS SYSTEM:  Not formally examined, due to lack of cooperation, but moving all limbs.  Lab Results:  Recent Labs  04/20/12 0620 04/21/12 0545  WBC 17.0* 14.9*  HGB 11.1* 9.9*  HCT 33.2* 29.2*  PLT 251 221    Recent Labs  04/20/12 0620 04/21/12 0545  NA 138 136  K 4.0 3.5  CL 104 102  CO2 29 26  GLUCOSE 197* 89  BUN 13 18  CREATININE 0.49* 0.63  CALCIUM 8.3* 8.3*   Recent Results (from the past 240 hour(s))  CULTURE, BLOOD (ROUTINE X 2)     Status: None   Collection Time    04/17/12 12:20 AM      Result Value Range Status   Specimen Description BLOOD LEFT HAND   Final   Special Requests BOTTLES DRAWN AEROBIC ONLY 5CC   Final   Culture  Setup Time 04/17/2012 04:33   Final   Culture     Final   Value:        BLOOD CULTURE RECEIVED NO GROWTH TO DATE CULTURE WILL BE HELD FOR 5 DAYS BEFORE ISSUING A FINAL NEGATIVE REPORT   Report Status PENDING   Incomplete  URINE CULTURE     Status: None   Collection Time    04/17/12  1:11 AM      Result Value Range Status   Specimen Description  URINE, RANDOM   Final   Special Requests NONE   Final   Culture  Setup Time 04/17/2012 05:55   Final   Colony Count >=100,000 COLONIES/ML   Final   Culture ESCHERICHIA COLI   Final   Report Status 04/18/2012 FINAL   Final   Organism ID, Bacteria ESCHERICHIA COLI   Final  CULTURE, BLOOD (ROUTINE X 2)     Status: None   Collection Time    04/17/12  3:05 AM      Result Value Range Status   Specimen Description BLOOD RIGHT HAND   Final   Special Requests BOTTLES DRAWN AEROBIC ONLY 5CC   Final   Culture  Setup Time 04/17/2012 08:41   Final   Culture     Final   Value:        BLOOD CULTURE RECEIVED NO GROWTH TO DATE CULTURE WILL BE HELD FOR 5 DAYS BEFORE ISSUING A FINAL NEGATIVE REPORT   Report Status PENDING   Incomplete  MRSA PCR SCREENING     Status: Abnormal   Collection Time    04/17/12  5:01 AM      Result Value Range  Status   MRSA by PCR POSITIVE (*) NEGATIVE Final   Comment:            The GeneXpert MRSA Assay (FDA     approved for NASAL specimens     only), is one component of a     comprehensive MRSA colonization     surveillance program. It is not     intended to diagnose MRSA     infection nor to guide or     monitor treatment for     MRSA infections.     RESULT CALLED TO, READ BACK BY AND VERIFIED WITH:     Royann Shivers AT 0645 3/28/14BY K BARR  CLOSTRIDIUM DIFFICILE BY PCR     Status: None   Collection Time    04/21/12  9:04 AM      Result Value Range Status   C difficile by pcr NEGATIVE  NEGATIVE Final     Studies/Results: No results found.  Medications: Scheduled Meds: . amLODipine  10 mg Oral Daily  . cefTRIAXone (ROCEPHIN)  IV  1 g Intravenous Q24H  . donepezil  10 mg Oral QHS  . enoxaparin (LOVENOX) injection  40 mg Subcutaneous Q24H  . escitalopram  20 mg Oral Daily  . feeding supplement  237 mL Oral BID WC  . Iloperidone  1 mg Oral BID  . insulin aspart  0-15 Units Subcutaneous TID WC  . insulin aspart  0-5 Units Subcutaneous QHS  . insulin  aspart  4 Units Subcutaneous TID WC  . insulin glargine  15 Units Subcutaneous QHS  . lisinopril  10 mg Oral q morning - 10a  . memantine  10 mg Oral BID  . mupirocin ointment  1 application Nasal BID  . senna  1 tablet Oral BID  . simvastatin  20 mg Oral QPM   Continuous Infusions:   PRN Meds:.acetaminophen, RESOURCE THICKENUP CLEAR, traMADol    LOS: 5 days   [name] [name],[name] [name]  [facility] Pager [phone]. If 8PM-8AM, please contact night-coverage at [email or web address], password [facility] 04/21/2012, 5:22 PM  LOS: 5 days

## 2012-04-22 DIAGNOSIS — F0390 Unspecified dementia without behavioral disturbance: Secondary | ICD-10-CM

## 2012-04-22 DIAGNOSIS — N390 Urinary tract infection, site not specified: Secondary | ICD-10-CM

## 2012-04-22 DIAGNOSIS — E860 Dehydration: Secondary | ICD-10-CM

## 2012-04-22 LAB — GLUCOSE, CAPILLARY
Glucose-Capillary: 153 mg/dL — ABNORMAL HIGH (ref 70–99)
Glucose-Capillary: 161 mg/dL — ABNORMAL HIGH (ref 70–99)
Glucose-Capillary: 206 mg/dL — ABNORMAL HIGH (ref 70–99)
Glucose-Capillary: 205 mg/dL — ABNORMAL HIGH (ref 70–99)
Glucose-Capillary: 271 mg/dL — ABNORMAL HIGH (ref 70–99)

## 2012-04-22 LAB — BASIC METABOLIC PANEL
CO2: 28 mEq/L (ref 19–32)
Chloride: 103 mEq/L (ref 96–112)
GFR calc Af Amer: 89 mL/min — ABNORMAL LOW (ref >90)
Potassium: 3.8 mEq/L (ref 3.5–5.1)

## 2012-04-22 LAB — CBC
HCT: 30.8 % — ABNORMAL LOW (ref 36.0–46.0)
Platelets: 246 10*3/uL (ref 150–400)
RBC: 3.53 MIL/uL — ABNORMAL LOW (ref 3.87–5.11)
RDW: 14.1 % (ref 11.5–15.5)
WBC: 11.6 10*3/uL — ABNORMAL HIGH (ref 4.0–10.5)

## 2012-04-22 MED ORDER — CIPROFLOXACIN HCL 250 MG PO TABS
250.0000 mg | ORAL_TABLET | Freq: Two times a day (BID) | ORAL | Status: DC
  Administered 2012-04-22 – 2012-04-23 (×2): 250 mg via ORAL
  Filled 2012-04-22 (×3): qty 1

## 2012-04-22 NOTE — Progress Notes (Signed)
Speech Language Pathology Dysphagia Treatment Patient Details Name: [name] [name] MRN: [id number] DOB: 10-Feb-1921 Today's Date: 04/22/2012 Time: 4098-1191 SLP Time Calculation (min): 10 min  Assessment / Plan / Recommendation Clinical Impression  F/u for swallowing: Pt alert, participatory, smiling, answering "I'm fine" when asked how she was feeling.  Demonstrated active anticipation and manipulation of bolus material, with palpable swallow trigger and absence of clinical signs of aspiration with nectar-thick liquids.  Intermittent cough noted with thin liquids.  Recommend D/Cing pt back to SNF on dysphagia 1 diet with nectars; SLP f/u at SNF for diet advancement as tolerated.  SLP to sign off.       Diet Recommendation  Continue with Current Diet: Nectar-thick liquid;Dysphagia 1 (puree)    SLP Plan All goals met   Pertinent Vitals/Pain No indication of pain   Swallowing Goals  SLP Swallowing Goals Patient will utilize recommended strategies during swallow to increase swallowing safety with: Total assistance  General Temperature Spikes Noted: No Respiratory Status: Room air Behavior/Cognition: Alert;Cooperative Oral Cavity - Dentition: Poor condition;Missing dentition Patient Positioning: Upright in bed  Oral Cavity - Oral Hygiene     Dysphagia Treatment Treatment focused on: Skilled observation of diet tolerance;Upgraded PO texture trials Treatment Methods/Modalities: Skilled observation Patient observed directly with PO's: Yes Type of PO's observed: Nectar-thick liquids;Thin liquids Feeding: Total assist Liquids provided via: Teaspoon;Cup Pharyngeal Phase Signs & Symptoms: Suspected delayed swallow initiation;Delayed cough with thins Type of cueing: Verbal;Tactile;Visual Amount of cueing: Total   [name] [name] L. [name], [place] CCC/SLP Pager [phone]      [name] [name] [name] [name] 04/22/2012, 3:55 PM

## 2012-04-22 NOTE — Evaluation (Signed)
Physical Therapy Evaluation Patient Details Name: [name] [name] MRN: [id number] DOB: January 04, 1922 Today's Date: 04/22/2012 Time: 4098-1191 PT Time Calculation (min): 22 min  PT Assessment / Plan / Recommendation Clinical Impression  Pt admitted from SNF with dehydration and dementia  with functional mobility limitations related to balance deficits, general deconditioning and cognitive status.  Pt will continue to need SNF level care upon d/c from hospital setting.     PT Assessment  Patient needs continued PT services    Follow Up Recommendations  SNF    Does the patient have the potential to tolerate intense rehabilitation      Barriers to Discharge None      Equipment Recommendations  None recommended by PT    Recommendations for Other Services     Frequency Min 3X/week    Precautions / Restrictions Precautions Precautions: Fall Restrictions Weight Bearing Restrictions: No   Pertinent Vitals/Pain No pain indicated by pt during session      Mobility  Bed Mobility Bed Mobility: Supine to Sit;Sit to Supine Supine to Sit: 2: Max assist Sit to Supine: 1: +2 Total assist Sit to Supine: Patient Percentage: 0% Details for Bed Mobility Assistance: Utilized pad to assist pt supine<>sit and to EOB Transfers Transfers: Not assessed (Pt states "I don't want to stand") Ambulation/Gait Ambulation/Gait Assistance: Not tested (comment)    Exercises     PT Diagnosis: Difficulty walking;Altered mental status  PT Problem List: Decreased strength;Decreased activity tolerance;Decreased mobility;Decreased cognition;Decreased knowledge of use of DME;Decreased safety awareness;Obesity;Decreased balance PT Treatment Interventions: DME instruction;Gait training;Functional mobility training;Therapeutic activities;Therapeutic exercise;Balance training;Cognitive remediation;Patient/family education   PT Goals Acute Rehab PT Goals PT Goal Formulation: Patient unable to participate in goal  setting Time For Goal Achievement: 05/06/12 Potential to Achieve Goals: Fair Pt will go Supine/Side to Sit: with min assist PT Goal: Supine/Side to Sit - Progress: Goal set today Pt will go Sit to Supine/Side: with mod assist PT Goal: Sit to Supine/Side - Progress: Goal set today Pt will go Sit to Stand: with +2 total assist PT Goal: Sit to Stand - Progress: Goal set today Pt will go Stand to Sit: with +2 total assist PT Goal: Stand to Sit - Progress: Goal set today Pt will Transfer Bed to Chair/Chair to Bed: with +2 total assist PT Transfer Goal: Bed to Chair/Chair to Bed - Progress: Goal set today  Visit Information  Last PT Received On: 04/22/12 Assistance Needed: +2    Subjective Data  Subjective: Min verbalization offered by pt until standing suggested when pt clearly stated "I don't want to stand up" Patient Stated Goal: none offered   Prior Functioning  Home Living Lives With: Other (Comment) (SNF resident) Prior Function Level of Independence: Needs assistance (Pt unable to offer insight to level of assist) Communication Communication: Expressive difficulties    Cognition  Cognition Overall Cognitive Status: Impaired Area of Impairment: Following commands Arousal/Alertness: Lethargic Orientation Level: Other (comment) Behavior During Session: Flat affect Following Commands: Follows one step commands inconsistently    Extremity/Trunk Assessment Right Upper Extremity Assessment RUE ROM/Strength/Tone: Unable to fully assess;Due to impaired cognition (Pt noted to move all limbs without assist during session) Left Upper Extremity Assessment LUE ROM/Strength/Tone: Unable to fully assess;Due to impaired cognition Right Lower Extremity Assessment RLE ROM/Strength/Tone: Unable to fully assess;Due to impaired cognition Left Lower Extremity Assessment LLE ROM/Strength/Tone: Unable to fully assess;Due to impaired cognition Trunk Assessment Trunk Assessment: Kyphotic    Balance Balance Balance Assessed: Yes Static Sitting Balance Static Sitting -  Balance Support: Right upper extremity supported;Left upper extremity supported Static Sitting - Level of Assistance: 3: Mod assist;4: Min assist;2: Max assist Static Sitting - Comment/# of Minutes: 8 min - pt demonstrating post and R drift progressing from max assist to maintain balance to min assist but utilizing rail or PT to maintain  End of Session PT - End of Session Activity Tolerance: Patient limited by fatigue Patient left: in bed;with call bell/phone within reach Nurse Communication: Mobility status  GP     [name] 04/22/2012, 12:27 PM

## 2012-04-22 NOTE — Progress Notes (Signed)
OT Cancellation Note  Patient Details Name: DECEMBER HEDTKE MRN: [id number] DOB: 20-Nov-1921   Cancelled Treatment:      Not pt from SNF and plans to return to SNF at discharge.  Currently total assist +2 for PT earlier today doing 0%. Based on history of advanced dementia, feel pt does not warrant acute care OT needs at this time.  Will defer OT eval to SNF.    Rini Moffit OTR/L Pager number [phone] 04/22/2012, 2:35 PM

## 2012-04-22 NOTE — Clinical Documentation Improvement (Signed)
CHF DOCUMENTATION CLARIFICATION QUERY  THIS DOCUMENT IS NOT A PERMANENT PART OF THE MEDICAL RECORD  TO RESPOND TO THE THIS QUERY, FOLLOW THE INSTRUCTIONS BELOW:  1. If needed, update documentation for the patient's encounter via the notes activity.  2. Access this query again and click edit on the In [employer].  3. After updating, or not, click F2 to complete all highlighted (required) fields concerning your review. Select "additional documentation in the medical record" OR "no additional documentation provided".  4. Click Sign note button.  5. The deficiency will fall out of your In Basket *Please let us know if you are not able to complete this workflow by phone or e-mail (listed below).  Please update your documentation within the medical record to reflect your response to this query.                                                                                    04/22/12  Dear [facility]. [facility],  In a better effort to capture your patient's severity of illness, reflect appropriate length of stay and utilization of resources, a review of the patient medical record has revealed the following indicators the diagnosis of Heart Failure.    Based on your clinical judgment, please clarify type & acuity and document in a progress note and/or discharge summary the clinical condition associated with the following supporting information:  In responding to this query please exercise your independent judgment.  The fact that a query is asked, does not imply that any particular answer is desired or expected.  Possible Clinical Conditions?  Chronic Systolic Congestive Heart Failure Chronic Diastolic Congestive Heart Failure Chronic Systolic & Diastolic Congestive Heart Failure Acute Systolic Congestive Heart Failure Acute Diastolic Congestive Heart Failure Acute Systolic & Diastolic Congestive Heart Failure Acute on Chronic Systolic Congestive Heart Failure Acute on Chronic Diastolic  Congestive Heart Failure Acute on Chronic Systolic & Diastolic  Congestive Heart Failure Other Condition________________________________________ Cannot Clinically Determine  Supporting Information:  Risk Factors:(As per notes)  Pt has "CHF" Signs & Symptoms: (As per notes) "Fall, Dehydration, UTI"  Treatment:(As per notes)  furosemide (LASIX) 20 MG tablet [[id number]]   Order Details    Dose: 20 mg Route: Oral Frequency:  Every morning - 10a       Reviewed: not answered Thank You,  Joanette Gula Delk RN, BSN, CCDS Clinical Documentation Specialist: [phone] Pager Health Information Management [place]

## 2012-04-22 NOTE — Progress Notes (Signed)
[facility] PROGRESS NOTE  ABBIGAYLE TOOLE BJY:[id number] DOB: [birth year] DOA: 04/16/2012 PCP: Jari Favre, MD  Assessment/Plan: Principal Problem:   Dehydration Active Problems:   Fall   UTI (lower urinary tract infection)   Dementia   CHF (congestive heart failure)   HTN (hypertension)     1. Fall: Patient presented from nursing home, following a fall. Precise circumstances are unclear, but fortunately, she does not appear to have suffered any bony injuries. Etiology is multifactorial, secondary to hypernatremia, dehydration and infection(UTI). Managing as described below.   2. Hyperglycemia/DM: Patient has no known previous history of DM, but random blood glucose was 399 at presentation. HBA1C is 8.8. Managing with ivi fluids and SSI. CBGs have improved. Adjusting SSI. Lantus commenced at bedtime on 04/18/12. Adjusting as indicated, with improved control.   3. Dehydration: She was noted to be tachycardic in the ED. This was likely related to dehydration and volume depletion, due to osmotic diuresis from hyperglycemia. Diuretic was of course discontinued, discontinued, and patient managed with iv fluids. As of 04/21/12, hydration status was satisfactory, and iv fluids were discontinued on that date.   4. Hypernatremia: Sodium was 149 at presentation, due to dehydration in the setting of poor oral intake, hyperglycemia and diuretics. Managed with hypotonic fluids, now discontinued. As of 04/18/12, sodium was 138, and hypernatremia had resolved.  Following Lytes, and today, sodium is 136. As described in #3 above, iv fluids have been discontinued. .    5. E. Coli UTI: U/A was positive for nitrites and leucocytes, with significant pyuria and bacteriuria. On iv Rocephin, day# 5. Cultures grew pan-sensitive E. Coli. Patient has remained afebrile, and antibiotic therapy will be concluded on 04/23/12. Switch to ciprofloxacin by mouth   6. AMS/Dementia: Patient was somewhat lethargic at  presentation. This was due to profound metabolic abnormalities and UTI, in the setting of dementia. Head CT scan is devoid of acute findings. Patient was too lethargic to eat on 04/17/12, but on 04/18/12, she was more alert, has been evaluated by SLP and recommended D1/Nectar thick. Mental status continued to improve, and is now practically at baseline.   7. HTN: Pre-admission antihypertensives were initially held, due to volume depletion and borderline BP. With improvement in hydration status, BP has started creeping up. Have resumed Lisinopril in increased dose of 10 mg daily, and have added Amlodipine on 04/20/12, with reasonable control.   8. Diarrhea: Patient had a large bowel movement on 04/20/12, after a period of constipation, followed by diarrheal stool. C. Difficile PCR was negative. As of 04/21/12, diarrhea had resolved.   Code Status: DNR/DNI  Family Communication:  Disposition Plan: Discharge to SNF   Brief narrative:  79 year old woman resident of [facility] nursing home, with history of Vascular dentia/Alzheimer's disease, psychosis, HTN, CHF, brought to the ED, following a fall from the bed. Patient is completely aphasic from advanced dementia and all the history has been obtained from ER chart and son. She was noted to have abrasions on her legs. Labs suggested hyperglycemia and hypernatremia. Admitted for further management.  Consultants:  N/A.  Procedures:  Head CT scan.  CXR.  Antibiotics:  N/A.      HPI/Subjective: Appears to be chronically ill, no complaints  Objective: Filed Vitals:   04/21/12 2138 04/22/12 0630 04/22/12 1013 04/22/12 1257  BP: 118/48 135/58 134/65 118/50  Pulse: 96 101 87 89  Temp: 98.2 F (36.8 C) 98 F (36.7 C) 97.8 F (36.6 C) 97.9 F (36.6 C)  TempSrc:  Oral Oral Oral   Resp: 18 17 18 16   Height:      Weight: 59.467 kg (131 lb 1.6 oz)     SpO2: 97% 99% 98% 95%    Intake/Output Summary (Last 24 hours) at 04/22/12 1408 Last data  filed at 04/22/12 0915  Gross per 24 hour  Intake    120 ml  Output      0 ml  Net    120 ml    Exam:  HENT:  Head: Atraumatic.  Nose: Nose normal.  Mouth/Throat: Oropharynx is clear and moist.  Eyes: Conjunctivae are normal. Pupils are equal, round, and reactive to light. No scleral icterus.  Neck: Neck supple. No tracheal deviation present.  Cardiovascular: Normal rate, regular rhythm, normal heart sounds and intact distal pulses.  Pulmonary/Chest: Effort normal and breath sounds normal. No respiratory distress.  Abdominal: Soft. Normal appearance and bowel sounds are normal. She exhibits no distension. There is no tenderness.  Musculoskeletal: She exhibits no edema and no tenderness.  Neurological: She is alert. No cranial nerve deficit.    Data Reviewed: Basic Metabolic Panel:  Recent Labs Lab 04/18/12 0500 04/18/12 1227 04/19/12 0920 04/20/12 0620 04/21/12 0545 04/22/12 0605  NA 146*  --  138 138 136 138  K 3.1*  --  3.5 4.0 3.5 3.8  CL 111  --  103 104 102 103  CO2 25  --  27 29 26 28   GLUCOSE 273*  --  315* 197* 89 173*  BUN 17  --  14 13 18 22   CREATININE 0.64  --  0.53 0.49* 0.63 0.65  CALCIUM 8.4  --  8.1* 8.3* 8.3* 8.7  MG  --  2.2 2.3  --   --   --     Liver Function Tests:  Recent Labs Lab 04/18/12 0500  AST 56*  ALT 48*  ALKPHOS 90  BILITOT 0.4  PROT 5.7*  ALBUMIN 2.7*   No results found for this basename: LIPASE, AMYLASE,  in the last 168 hours No results found for this basename: AMMONIA,  in the last 168 hours  CBC:  Recent Labs Lab 04/18/12 0500 04/19/12 0920 04/20/12 0620 04/21/12 0545 04/22/12 0605  WBC 15.1* 14.6* 17.0* 14.9* 11.6*  HGB 11.4* 11.0* 11.1* 9.9* 10.5*  HCT 33.9* 33.6* 33.2* 29.2* 30.8*  MCV 89.7 91.3 89.0 86.9 87.3  PLT 288 250 251 221 246    Cardiac Enzymes:  Recent Labs Lab 04/17/12 0008  TROPONINI <0.30   BNP (last 3 results) No results found for this basename: PROBNP,  in the last 8760  hours   CBG:  Recent Labs Lab 04/21/12 1213 04/21/12 1654 04/21/12 2117 04/22/12 0744 04/22/12 1130  GLUCAP 237* 103* 187* 153* 161*    Recent Results (from the past 240 hour(s))  CULTURE, BLOOD (ROUTINE X 2)     Status: None   Collection Time    04/17/12 12:20 AM      Result Value Range Status   Specimen Description BLOOD LEFT HAND   Final   Special Requests BOTTLES DRAWN AEROBIC ONLY 5CC   Final   Culture  Setup Time 04/17/2012 04:33   Final   Culture     Final   Value:        BLOOD CULTURE RECEIVED NO GROWTH TO DATE CULTURE WILL BE HELD FOR 5 DAYS BEFORE ISSUING A FINAL NEGATIVE REPORT   Report Status PENDING   Incomplete  URINE CULTURE     Status: None  Collection Time    04/17/12  1:11 AM      Result Value Range Status   Specimen Description URINE, RANDOM   Final   Special Requests NONE   Final   Culture  Setup Time 04/17/2012 05:55   Final   Colony Count >=100,000 COLONIES/ML   Final   Culture ESCHERICHIA COLI   Final   Report Status 04/18/2012 FINAL   Final   Organism ID, Bacteria ESCHERICHIA COLI   Final  CULTURE, BLOOD (ROUTINE X 2)     Status: None   Collection Time    04/17/12  3:05 AM      Result Value Range Status   Specimen Description BLOOD RIGHT HAND   Final   Special Requests BOTTLES DRAWN AEROBIC ONLY 5CC   Final   Culture  Setup Time 04/17/2012 08:41   Final   Culture     Final   Value:        BLOOD CULTURE RECEIVED NO GROWTH TO DATE CULTURE WILL BE HELD FOR 5 DAYS BEFORE ISSUING A FINAL NEGATIVE REPORT   Report Status PENDING   Incomplete  MRSA PCR SCREENING     Status: Abnormal   Collection Time    04/17/12  5:01 AM      Result Value Range Status   MRSA by PCR POSITIVE (*) NEGATIVE Final   Comment:            The GeneXpert MRSA Assay (FDA     approved for NASAL specimens     only), is one component of a     comprehensive MRSA colonization     surveillance program. It is not     intended to diagnose MRSA     infection nor to guide or      monitor treatment for     MRSA infections.     RESULT CALLED TO, READ BACK BY AND VERIFIED WITH:     N NYLANDER,RN AT 0645 3/28/14BY K BARR  CLOSTRIDIUM DIFFICILE BY PCR     Status: None   Collection Time    04/21/12  9:04 AM      Result Value Range Status   C difficile by pcr NEGATIVE  NEGATIVE Final     Studies: Ct Head Wo Contrast  04/17/2012  *RADIOLOGY REPORT*  Clinical Data: Altered mental status.  Fall from bed.  CT HEAD WITHOUT CONTRAST  Technique:  Contiguous axial images were obtained from the base of the skull through the vertex without contrast.  Comparison: None.  Findings: No mass lesion, mass effect, midline shift, hydrocephalus, hemorrhage.  No acute territorial cortical ischemia/infarct. Atrophy and chronic ischemic white matter disease is present.  Old left thalamic lacunar infarct.  Calvarium intact. No skull fracture.  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  IMPRESSION: Atrophy and chronic ischemic white matter disease without acute intracranial abnormality.   Original Report Authenticated By: Andreas Newport, M.D.    Dg Chest Portable 1 View  04/17/2012  *RADIOLOGY REPORT*  Clinical Data: Altered level of consciousness.  Fell.  PORTABLE CHEST - 1 VIEW  Comparison: 03/17/2012.  Findings: The cardiac silhouette, mediastinal and hilar contours are normal and stable.  There is tortuosity and calcification of the thoracic aorta.  The lungs are clear of acute process.  No pleural effusion.  The bony thorax is intact.  IMPRESSION: No acute cardiopulmonary findings.   Original Report Authenticated By: Rudie Meyer, M.D.     Scheduled Meds: . amLODipine  10 mg Oral Daily  .  ciprofloxacin  250 mg Oral BID  . donepezil  10 mg Oral QHS  . enoxaparin (LOVENOX) injection  40 mg Subcutaneous Q24H  . escitalopram  20 mg Oral Daily  . feeding supplement  237 mL Oral BID WC  . Iloperidone  1 mg Oral BID  . insulin aspart  0-15 Units Subcutaneous TID WC  .  insulin aspart  0-5 Units Subcutaneous QHS  . insulin aspart  4 Units Subcutaneous TID WC  . insulin glargine  15 Units Subcutaneous QHS  . lisinopril  10 mg Oral q morning - 10a  . memantine  10 mg Oral BID  . senna  1 tablet Oral BID  . simvastatin  20 mg Oral QPM   Continuous Infusions:   Principal Problem:   Dehydration Active Problems:   Fall   UTI (lower urinary tract infection)   Dementia   CHF (congestive heart failure)   HTN (hypertension)    Time spent: 40 minutes   [facility]  [facility] Pager [phone]. If 8PM-8AM, please contact night-coverage at [email or web address], password [facility] 04/22/2012, 2:08 PM  LOS: 6 days

## 2012-04-22 NOTE — Progress Notes (Signed)
04/22/2012 patient pneumonia vaccine need to be address, Rn did contact facility where patient live. Rn did spoke to the facility today, but they stated on her record it shows her pneumonia and flu vaccine is unknown. Facility also stated family was not sure when patient had her vaccine. Patient have some confusion. Rn did pass it on to third shift to let the first shift nurse to ask son if he want his mother to have the vaccine. [facility] RN.

## 2012-04-23 LAB — GLUCOSE, CAPILLARY
Glucose-Capillary: 270 mg/dL — ABNORMAL HIGH (ref 70–99)
Glucose-Capillary: 165 mg/dL — ABNORMAL HIGH (ref 70–99)

## 2012-04-23 LAB — COMPREHENSIVE METABOLIC PANEL
AST: 14 U/L (ref 0–37)
Albumin: 2.1 g/dL — ABNORMAL LOW (ref 3.5–5.2)
BUN: 30 mg/dL — ABNORMAL HIGH (ref 6–23)
CO2: 25 mEq/L (ref 19–32)
Calcium: 8.8 mg/dL (ref 8.4–10.5)
Creatinine, Ser: 0.7 mg/dL (ref 0.50–1.10)
GFR calc non Af Amer: 75 mL/min — ABNORMAL LOW (ref >90)

## 2012-04-23 LAB — CULTURE, BLOOD (ROUTINE X 2): Culture: NO GROWTH

## 2012-04-23 MED ORDER — AMLODIPINE BESYLATE 10 MG PO TABS: 10.0000 mg | ORAL_TABLET | Freq: Every day | ORAL | Status: DC

## 2012-04-23 MED ORDER — INSULIN ASPART 100 UNIT/ML ~~LOC~~ SOLN: 4.0000 [IU] | Freq: Three times a day (TID) | SUBCUTANEOUS | Status: DC

## 2012-04-23 MED ORDER — INSULIN GLARGINE 100 UNIT/ML ~~LOC~~ SOLN: 20.0000 [IU] | Freq: Every day | SUBCUTANEOUS | Status: DC

## 2012-04-23 MED ORDER — CIPROFLOXACIN HCL 250 MG PO TABS: 250.0000 mg | ORAL_TABLET | Freq: Two times a day (BID) | ORAL | Status: AC

## 2012-04-23 MED ORDER — CIPROFLOXACIN HCL 250 MG PO TABS: 250.0000 mg | ORAL_TABLET | Freq: Two times a day (BID) | ORAL | Status: DC

## 2012-04-23 NOTE — Discharge Summary (Addendum)
Physician Discharge Summary  [name] [name] MRN: [id number] DOB/AGE: 77-Jul-1923 77 y.o.  PCP: Jari Favre, MD   Admit date: 04/16/2012 Discharge date: 04/23/2012  Discharge Diagnoses:      Dehydration Active Problems:   Fall   UTI (lower urinary tract infection)   Dementia   CHF (congestive heart failure)   HTN (hypertension)     Medication List- all medications can be crushed     TAKE these medications       acetaminophen 325 MG tablet  Commonly known as:  TYLENOL  Take 650 mg by mouth every 4 (four) hours as needed for pain.     amLODipine 10 MG tablet  Commonly known as:  NORVASC  Take 1 tablet (10 mg total) by mouth daily.     bismuth subsalicylate 262 MG/15ML suspension  Commonly known as:  PEPTO BISMOL  Take 30 mLs by mouth every hour as needed for indigestion (Max 4 doses/24 hours.).     ciprofloxacin 250 MG tablet  Commonly known as:  CIPRO  Take 1 tablet (250 mg total) by mouth 2 (two) times daily. Stop 4/5     donepezil 10 MG tablet  Commonly known as:  ARICEPT  Take 10 mg by mouth at bedtime.     escitalopram 20 MG tablet  Commonly known as:  LEXAPRO  Take 20 mg by mouth daily.     eucerin lotion  Apply topically daily. Apply to bilateral arms and legs for dry skin     FANAPT 2 MG Tabs  Generic drug:  Iloperidone  Take 1 mg by mouth 2 (two) times daily. To clear thoughts     feeding supplement Liqd  Take 237 mLs by mouth 2 (two) times daily with a meal. Vanilla.     fish oil-omega-3 fatty acids 1000 MG capsule  Take 1 g by mouth daily.     furosemide 20 MG tablet  Commonly known as:  LASIX  Take 20 mg by mouth every morning.     guaifenesin 100 MG/5ML syrup  Commonly known as:  ROBITUSSIN  Take 100 mg by mouth 3 (three) times daily as needed for cough.     insulin aspart 100 UNIT/ML injection  Commonly known as:  novoLOG  Inject 4 Units into the skin 3 (three) times daily with meals.     insulin glargine 100 UNIT/ML injection   Commonly known as:  LANTUS  Inject 0.2 mLs (20 Units total) into the skin at bedtime.     lisinopril 5 MG tablet  Commonly known as:  PRINIVIL,ZESTRIL  Take 5 mg by mouth every morning.     memantine 10 MG tablet  Commonly known as:  NAMENDA  Take 10 mg by mouth 2 (two) times daily.     METAFOLBIC PLUS 6-2-600 MG Tabs  Take 1 tablet by mouth daily.     ondansetron 8 MG disintegrating tablet  Commonly known as:  ZOFRAN ODT  Take 1 tablet (8 mg total) by mouth every 8 (eight) hours as needed for nausea.     potassium chloride SA 20 MEQ tablet  Commonly known as:  K-DUR,KLOR-CON  Take 1 tablet (20 mEq total) by mouth 2 (two) times daily.     simvastatin 20 MG tablet  Commonly known as:  ZOCOR  Take 20 mg by mouth every evening.     traMADol 50 MG tablet  Commonly known as:  ULTRAM  Take 50 mg by mouth every 6 (six) hours as needed for  pain.     Vitamin D (Ergocalciferol) 50000 UNITS Caps  Commonly known as:  DRISDOL  Take 50,000 Units by mouth every Tuesday.        Discharge Condition: Stable  Disposition: SNF   Consults: None  Significant Diagnostic Studies: Ct Head Wo Contrast  04/17/2012  *RADIOLOGY REPORT*  Clinical Data: Altered mental status.  Fall from bed.  CT HEAD WITHOUT CONTRAST  Technique:  Contiguous axial images were obtained from the base of the skull through the vertex without contrast.  Comparison: None.  Findings: No mass lesion, mass effect, midline shift, hydrocephalus, hemorrhage.  No acute territorial cortical ischemia/infarct. Atrophy and chronic ischemic white matter disease is present.  Old left thalamic lacunar infarct.  Calvarium intact. No skull fracture.  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  IMPRESSION: Atrophy and chronic ischemic white matter disease without acute intracranial abnormality.   Original Report Authenticated By: Andreas Newport, M.D.    Dg Chest Portable 1 View  04/17/2012  *RADIOLOGY REPORT*   Clinical Data: Altered level of consciousness.  Fell.  PORTABLE CHEST - 1 VIEW  Comparison: 03/17/2012.  Findings: The cardiac silhouette, mediastinal and hilar contours are normal and stable.  There is tortuosity and calcification of the thoracic aorta.  The lungs are clear of acute process.  No pleural effusion.  The bony thorax is intact.  IMPRESSION: No acute cardiopulmonary findings.   Original Report Authenticated By: Rudie Meyer, M.D.        Microbiology: Recent Results (from the past 240 hour(s))  CULTURE, BLOOD (ROUTINE X 2)     Status: None   Collection Time    04/17/12 12:20 AM      Result Value Range Status   Specimen Description BLOOD LEFT HAND   Final   Special Requests BOTTLES DRAWN AEROBIC ONLY 5CC   Final   Culture  Setup Time 04/17/2012 04:33   Final   Culture     Final   Value:        BLOOD CULTURE RECEIVED NO GROWTH TO DATE CULTURE WILL BE HELD FOR 5 DAYS BEFORE ISSUING A FINAL NEGATIVE REPORT   Report Status PENDING   Incomplete  URINE CULTURE     Status: None   Collection Time    04/17/12  1:11 AM      Result Value Range Status   Specimen Description URINE, RANDOM   Final   Special Requests NONE   Final   Culture  Setup Time 04/17/2012 05:55   Final   Colony Count >=100,000 COLONIES/ML   Final   Culture ESCHERICHIA COLI   Final   Report Status 04/18/2012 FINAL   Final   Organism ID, Bacteria ESCHERICHIA COLI   Final  CULTURE, BLOOD (ROUTINE X 2)     Status: None   Collection Time    04/17/12  3:05 AM      Result Value Range Status   Specimen Description BLOOD RIGHT HAND   Final   Special Requests BOTTLES DRAWN AEROBIC ONLY 5CC   Final   Culture  Setup Time 04/17/2012 08:41   Final   Culture     Final   Value:        BLOOD CULTURE RECEIVED NO GROWTH TO DATE CULTURE WILL BE HELD FOR 5 DAYS BEFORE ISSUING A FINAL NEGATIVE REPORT   Report Status PENDING   Incomplete  MRSA PCR SCREENING     Status: Abnormal   Collection Time    04/17/12  5:01 AM  Result  Value Range Status   MRSA by PCR POSITIVE (*) NEGATIVE Final   Comment:            The GeneXpert MRSA Assay (FDA     approved for NASAL specimens     only), is one component of a     comprehensive MRSA colonization     surveillance program. It is not     intended to diagnose MRSA     infection nor to guide or     monitor treatment for     MRSA infections.     RESULT CALLED TO, READ BACK BY AND VERIFIED WITH:     Royann Shivers AT 0645 3/28/14BY K BARR  CLOSTRIDIUM DIFFICILE BY PCR     Status: None   Collection Time    04/21/12  9:04 AM      Result Value Range Status   C difficile by pcr NEGATIVE  NEGATIVE Final     Labs: Results for orders placed during the hospital encounter of 04/16/12 (from the past 48 hour(s))  GLUCOSE, CAPILLARY     Status: Abnormal   Collection Time    04/21/12 12:13 PM      Result Value Range   Glucose-Capillary 237 (*) 70 - 99 mg/dL  GLUCOSE, CAPILLARY     Status: Abnormal   Collection Time    04/21/12  4:54 PM      Result Value Range   Glucose-Capillary 103 (*) 70 - 99 mg/dL   Comment 1 Notify RN     Comment 2 Documented in Chart    GLUCOSE, CAPILLARY     Status: Abnormal   Collection Time    04/21/12  9:17 PM      Result Value Range   Glucose-Capillary 187 (*) 70 - 99 mg/dL  CBC     Status: Abnormal   Collection Time    04/22/12  6:05 AM      Result Value Range   WBC 11.6 (*) 4.0 - 10.5 K/uL   RBC 3.53 (*) 3.87 - 5.11 MIL/uL   Hemoglobin 10.5 (*) 12.0 - 15.0 g/dL   HCT 16.1 (*) 09.6 - 04.5 %   MCV 87.3  78.0 - 100.0 fL   MCH 29.7  26.0 - 34.0 pg   MCHC 34.1  30.0 - 36.0 g/dL   RDW 40.9  81.1 - 91.4 %   Platelets 246  150 - 400 K/uL  BASIC METABOLIC PANEL     Status: Abnormal   Collection Time    04/22/12  6:05 AM      Result Value Range   Sodium 138  135 - 145 mEq/L   Potassium 3.8  3.5 - 5.1 mEq/L   Chloride 103  96 - 112 mEq/L   CO2 28  19 - 32 mEq/L   Glucose, Bld 173 (*) 70 - 99 mg/dL   BUN 22  6 - 23 mg/dL   Creatinine, Ser  7.82  0.50 - 1.10 mg/dL   Calcium 8.7  8.4 - 95.6 mg/dL   GFR calc non Af Amer 77 (*) >90 mL/min   GFR calc Af Amer 89 (*) >90 mL/min   Comment:            The eGFR has been calculated     using the CKD EPI equation.     This calculation has not been     validated in all clinical     situations.     eGFR's persistently     <  90 mL/min signify     possible Chronic Kidney Disease.  GLUCOSE, CAPILLARY     Status: Abnormal   Collection Time    04/22/12  7:44 AM      Result Value Range   Glucose-Capillary 153 (*) 70 - 99 mg/dL  GLUCOSE, CAPILLARY     Status: Abnormal   Collection Time    04/22/12 11:30 AM      Result Value Range   Glucose-Capillary 161 (*) 70 - 99 mg/dL  GLUCOSE, CAPILLARY     Status: Abnormal   Collection Time    04/22/12  2:32 PM      Result Value Range   Glucose-Capillary 206 (*) 70 - 99 mg/dL  GLUCOSE, CAPILLARY     Status: Abnormal   Collection Time    04/22/12  5:22 PM      Result Value Range   Glucose-Capillary 205 (*) 70 - 99 mg/dL  GLUCOSE, CAPILLARY     Status: Abnormal   Collection Time    04/22/12  9:54 PM      Result Value Range   Glucose-Capillary 271 (*) 70 - 99 mg/dL  COMPREHENSIVE METABOLIC PANEL     Status: Abnormal   Collection Time    04/23/12  5:00 AM      Result Value Range   Sodium 136  135 - 145 mEq/L   Potassium 4.2  3.5 - 5.1 mEq/L   Chloride 104  96 - 112 mEq/L   CO2 25  19 - 32 mEq/L   Glucose, Bld 313 (*) 70 - 99 mg/dL   BUN 30 (*) 6 - 23 mg/dL   Creatinine, Ser 1.61  0.50 - 1.10 mg/dL   Calcium 8.8  8.4 - 09.6 mg/dL   Total Protein 5.0 (*) 6.0 - 8.3 g/dL   Albumin 2.1 (*) 3.5 - 5.2 g/dL   AST 14  0 - 37 U/L   ALT 25  0 - 35 U/L   Alkaline Phosphatase 73  39 - 117 U/L   Total Bilirubin 0.1 (*) 0.3 - 1.2 mg/dL   GFR calc non Af Amer 75 (*) >90 mL/min   GFR calc Af Amer 87 (*) >90 mL/min   Comment:            The eGFR has been calculated     using the CKD EPI equation.     This calculation has not been     validated  in all clinical     situations.     eGFR's persistently     <90 mL/min signify     possible Chronic Kidney Disease.  GLUCOSE, CAPILLARY     Status: Abnormal   Collection Time    04/23/12  7:38 AM      Result Value Range   Glucose-Capillary 270 (*) 70 - 99 mg/dL   Comment 1 Documented in Chart     Comment 2 Notify RN       HPI : Brief narrative:  79 year old woman resident of [facility] nursing home, with history of Vascular dentia/Alzheimer's disease, psychosis, HTN, CHF, brought to the ED, following a fall from the bed. Patient is completely aphasic from advanced dementia and all the history has been obtained from ER chart and son. She was noted to have abrasions on her legs. Labs suggested hyperglycemia and hypernatremia. Admitted for further management.    HOSPITAL COURSE: 1. Fall: Patient presented from nursing home, following a fall. Precise circumstances are unclear, but fortunately, she does  not appear to have suffered any bony injuries. Etiology is multifactorial, secondary to hypernatremia, dehydration and infection(UTI). Managing as described below.   2. Hyperglycemia/DM: Patient has no known previous history of DM, but random blood glucose was 399 at presentation. HBA1C is 8.8. Managed with ivi fluids and SSI. CBGs have improved. Started on pre-meal insulin and Lantus at bedtime on 04/18/12. Adjusting as indicated, with improved control.   3. Dehydration: She was noted to be tachycardic in the ED. This was likely related to dehydration and volume depletion, due to osmotic diuresis from hyperglycemia. Diuretic was of course discontinued, and patient hydrated. As of 04/21/12, hydration status was satisfactory, and iv fluids were discontinued on that date. May resume diuretic upon discharge  4. Hypernatremia resolved: Sodium was 149 at presentation, due to dehydration in the setting of poor oral intake, hyperglycemia and diuretics. Managed with hypotonic fluids, now discontinued.  As of 04/18/12, sodium was 138, and hypernatremia had resolved. Following Lytes, and today, sodium is 136. As described in #3 above, iv fluids have been discontinued. .    5. E. Coli UTI: U/A was positive for nitrites and leucocytes, with significant pyuria and bacteriuria. On iv Rocephin, day# 5. Cultures grew pan-sensitive E. Coli. Patient has remained afebrile, switched to ciprofloxacin for another 2 days  6. AMS/Dementia: Patient was somewhat lethargic at presentation. This was due to profound metabolic abnormalities and UTI, in the setting of dementia. Head CT scan is devoid of acute findings. Patient was too lethargic to eat on 04/17/12, but on 04/18/12, she was more alert, has been evaluated by SLP and recommended D1/Nectar thick. Mental status continued to improve, and is now practically at baseline.    7. HTN: Pre-admission antihypertensives were initially held, due to volume depletion and borderline BP. With improvement in hydration status, BP has started creeping up. Have resumed Lisinopril in increased dose of 10 mg daily, and have added Amlodipine on 04/20/12, with reasonable control.    8. Diarrhea: Patient had a large bowel movement on 04/20/12, after a period of constipation, followed by diarrheal stool. C. Difficile PCR was negative. As of 04/21/12, diarrhea had resolved.       Discharge Exam: Blood pressure 132/43, pulse 84, temperature 97.6 F (36.4 C), temperature source Oral, resp. rate 20, height 5\' 3"  (1.6 m), weight 60.1 kg (132 lb 7.9 oz), SpO2 100.00%.   General: Comfortable, alert, not communicative, not short of breath at rest.  HEENT: No clinical pallor, no jaundice, no conjunctival injection or discharge. Hydration is satisfactory.  NECK: Supple, JVP not seen, no carotid bruits, no palpable lymphadenopathy, no palpable goiter.  CHEST: Clinically clear to auscultation, no wheezes, no crackles.  HEART: Sounds 1 and 2 heard, normal, regular, no murmurs.  ABDOMEN:  Moderately obese, soft, non-tender, no palpable organomegaly, no palpable masses, normal bowel sounds.  GENITALIA: Not examined.  LOWER EXTREMITIES: No pitting edema, palpable peripheral pulses.  MUSCULOSKELETAL SYSTEM: Generalized osteoarthritic changes, otherwise, normal.  CENTRAL NERVOUS SYSTEM: Not formally examined, due to lack of cooperation, but moving all        Discharge Orders   Future Orders Complete By Expires     Diet - low sodium heart healthy  As directed     Increase activity slowly  As directed          Signed: [facility] 04/23/2012, 9:49 AM

## 2012-04-23 NOTE — Clinical Social Work Note (Signed)
Patient medically stable to discharge back to [facility] today. Discharge information forwarded to facility and approved by Wellness nurse Crystal. CSW facilitated transport back to ALF today.  Genelle Bal, MSW, LCSW [phone]

## 2012-04-24 LAB — GLUCOSE, CAPILLARY: Glucose-Capillary: 230 mg/dL — ABNORMAL HIGH (ref 70–99)

## 2013-07-20 ENCOUNTER — Encounter (HOSPITAL_COMMUNITY): Payer: Self-pay | Admitting: Emergency Medicine

## 2013-07-20 ENCOUNTER — Observation Stay (HOSPITAL_COMMUNITY)
Admission: EM | Admit: 2013-07-20 | Discharge: 2013-07-21 | Disposition: A | Discharge status: Hospice | Payer: Medicare Other | Attending: Internal Medicine | Admitting: Internal Medicine

## 2013-07-20 ENCOUNTER — Emergency Department (HOSPITAL_COMMUNITY): Payer: Medicare Other

## 2013-07-20 DIAGNOSIS — N390 Urinary tract infection, site not specified: Secondary | ICD-10-CM | POA: Diagnosis present

## 2013-07-20 DIAGNOSIS — I5022 Chronic systolic (congestive) heart failure: Secondary | ICD-10-CM

## 2013-07-20 DIAGNOSIS — Z515 Encounter for palliative care: Secondary | ICD-10-CM | POA: Insufficient documentation

## 2013-07-20 DIAGNOSIS — F29 Unspecified psychosis not due to a substance or known physiological condition: Secondary | ICD-10-CM | POA: Diagnosis not present

## 2013-07-20 DIAGNOSIS — I509 Heart failure, unspecified: Secondary | ICD-10-CM | POA: Insufficient documentation

## 2013-07-20 DIAGNOSIS — F0280 Dementia in other diseases classified elsewhere without behavioral disturbance: Secondary | ICD-10-CM | POA: Insufficient documentation

## 2013-07-20 DIAGNOSIS — Z8673 Personal history of transient ischemic attack (TIA), and cerebral infarction without residual deficits: Secondary | ICD-10-CM | POA: Diagnosis not present

## 2013-07-20 DIAGNOSIS — A419 Sepsis, unspecified organism: Secondary | ICD-10-CM | POA: Diagnosis not present

## 2013-07-20 DIAGNOSIS — E860 Dehydration: Secondary | ICD-10-CM

## 2013-07-20 DIAGNOSIS — G309 Alzheimer's disease, unspecified: Secondary | ICD-10-CM | POA: Insufficient documentation

## 2013-07-20 DIAGNOSIS — I10 Essential (primary) hypertension: Secondary | ICD-10-CM | POA: Diagnosis not present

## 2013-07-20 DIAGNOSIS — F0390 Unspecified dementia without behavioral disturbance: Secondary | ICD-10-CM

## 2013-07-20 LAB — URINE MICROSCOPIC-ADD ON

## 2013-07-20 LAB — URINALYSIS, ROUTINE W REFLEX MICROSCOPIC
BILIRUBIN URINE: NEGATIVE
Glucose, UA: >1000 mg/dL — AB
Ketones, ur: 15 mg/dL — AB
Nitrite: NEGATIVE
Protein, ur: 30 mg/dL — AB
SPECIFIC GRAVITY, URINE: 1.027 (ref 1.005–1.030)
UROBILINOGEN UA: 0.2 mg/dL (ref 0.0–1.0)
pH: 5 (ref 5.0–8.0)

## 2013-07-20 LAB — PROTIME-INR
INR: 1.11 (ref 0.00–1.49)
PROTHROMBIN TIME: 14.3 s (ref 11.6–15.2)

## 2013-07-20 LAB — CBC
HCT: 47.9 % — ABNORMAL HIGH (ref 36.0–46.0)
HEMOGLOBIN: 15.9 g/dL — AB (ref 12.0–15.0)
MCH: 30.2 pg (ref 26.0–34.0)
MCHC: 33.2 g/dL (ref 30.0–36.0)
MCV: 91.1 fL (ref 78.0–100.0)
Platelets: 268 10*3/uL (ref 150–400)
RBC: 5.26 MIL/uL — ABNORMAL HIGH (ref 3.87–5.11)
RDW: 14.2 % (ref 11.5–15.5)
WBC: 20.7 10*3/uL — AB (ref 4.0–10.5)

## 2013-07-20 LAB — COMPREHENSIVE METABOLIC PANEL
ALBUMIN: 4 g/dL (ref 3.5–5.2)
ALK PHOS: 80 U/L (ref 39–117)
ALT: 20 U/L (ref 0–35)
AST: 13 U/L (ref 0–37)
BILIRUBIN TOTAL: 0.5 mg/dL (ref 0.3–1.2)
BUN: 59 mg/dL — ABNORMAL HIGH (ref 6–23)
CHLORIDE: 105 meq/L (ref 96–112)
CO2: 23 mEq/L (ref 19–32)
Calcium: 10 mg/dL (ref 8.4–10.5)
Creatinine, Ser: 1.67 mg/dL — ABNORMAL HIGH (ref 0.50–1.10)
GFR calc Af Amer: 30 mL/min — ABNORMAL LOW (ref >90)
GFR calc non Af Amer: 26 mL/min — ABNORMAL LOW (ref >90)
GLUCOSE: 551 mg/dL — AB (ref 70–99)
POTASSIUM: 4.4 meq/L (ref 3.7–5.3)
SODIUM: 147 meq/L (ref 137–147)
Total Protein: 7.8 g/dL (ref 6.0–8.3)

## 2013-07-20 LAB — CBG MONITORING, ED: Glucose-Capillary: 537 mg/dL — ABNORMAL HIGH (ref 70–99)

## 2013-07-20 LAB — MRSA PCR SCREENING: MRSA by PCR: NEGATIVE

## 2013-07-20 LAB — PRO B NATRIURETIC PEPTIDE: PRO B NATRI PEPTIDE: 4206 pg/mL — AB (ref 0–450)

## 2013-07-20 MED ORDER — ONDANSETRON HCL 4 MG/2ML IJ SOLN: 4.0000 mg | Freq: Four times a day (QID) | INTRAMUSCULAR | Status: DC | PRN

## 2013-07-20 MED ORDER — ONDANSETRON HCL 4 MG PO TABS: 4.0000 mg | ORAL_TABLET | Freq: Four times a day (QID) | ORAL | Status: DC | PRN

## 2013-07-20 MED ORDER — ACETAMINOPHEN 325 MG PO TABS: 650.0000 mg | ORAL_TABLET | Freq: Four times a day (QID) | ORAL | Status: DC | PRN

## 2013-07-20 MED ORDER — LORAZEPAM 2 MG/ML IJ SOLN: 1.0000 mg | Freq: Four times a day (QID) | INTRAMUSCULAR | Status: DC | PRN

## 2013-07-20 MED ORDER — LORAZEPAM 1 MG PO TABS: 1.0000 mg | ORAL_TABLET | Freq: Four times a day (QID) | ORAL | Status: DC | PRN

## 2013-07-20 MED ORDER — MORPHINE SULFATE 2 MG/ML IJ SOLN: 2.0000 mg | INTRAMUSCULAR | Status: DC | PRN

## 2013-07-20 MED ORDER — ACETAMINOPHEN 650 MG RE SUPP: 650.0000 mg | Freq: Four times a day (QID) | RECTAL | Status: DC | PRN

## 2013-07-20 MED ORDER — SODIUM CHLORIDE 0.9 % IV SOLN
Freq: Once | INTRAVENOUS | Status: AC
  Administered 2013-07-20: 14:00:00 via INTRAVENOUS

## 2013-07-20 NOTE — ED Notes (Signed)
[facility] this [facility] not [facility].  Pt is usually soft spoken and conversational.  Staff notified hospice and hospice agreed for transport to ED.  EMS not sure why pt is on hospice.  On way in pt had several runs of SVT and runs of coup PAC.  Pt arouses to voice but is not verbal.  Pt withdraw to painful stimuli.

## 2013-07-20 NOTE — ED Notes (Signed)
Moving pt to C23 to await hospice assessment.  Move to C23.

## 2013-07-20 NOTE — ED Notes (Signed)
Dr Jeraldine LootsLockwood aware no orders given to treated elevated blood sugar

## 2013-07-20 NOTE — ED Notes (Addendum)
Critical lab 551. MD aware.  Blood sugars will not be treated per Dr. Jeraldine LootsLockwood.

## 2013-07-20 NOTE — Progress Notes (Addendum)
Admitted from ED to room 6N 26, patient is not verbally responsive, responsive to pain stimuli but do not open eyes at all. Md made ware of the admission and Md stated that family is wanting comfort care , VSS at this time, not in any distress, kept comfortable. Will continue to monitor. Patient is a DNR.will endorse appropriately.

## 2013-07-20 NOTE — ED Provider Notes (Signed)
CSN: [id number]     Arrival date & time 07/20/13  1204 History   First MD Initiated Contact with Patient 07/20/13 1233     Chief Complaint  Patient presents with  . Altered Mental Status      HPI  Patient presents nursing facility with concerns of altered mental status. Notably, the patient has history of prior stroke and is aphasic. This is a level V caveat. Per report patient is awake at baseline, but today was found to be less interactive. Per report the patient is a hospice patient. Patient has no capacity to provide any details of the history of present illness.   Past Medical History  Diagnosis Date  . Alzheimers disease   . Vascular dementia   . Psychosis   . Hypertension   . CHF (congestive heart failure)    History reviewed. No pertinent past surgical history. History reviewed. No pertinent family history. History  Substance Use Topics  . Smoking status: Never Smoker   . Smokeless tobacco: Not on file  . Alcohol Use: No   OB History   Grav Para Term Preterm Abortions TAB SAB Ect Mult Living                 Review of Systems  Unable to perform ROS: Dementia      Allergies  Review of patient's allergies indicates no known allergies.  Home Medications   Prior to Admission medications   Medication Sig Start Date End Date Taking? Authorizing Ramonica Grigg  acetaminophen (TYLENOL) 325 MG tablet Take 650 mg by mouth every 4 (four) hours as needed for pain.   Yes Historical Ronnesha Mester, MD  bismuth subsalicylate (PEPTO BISMOL) 262 MG/15ML suspension Take 30 mLs by mouth every hour as needed for indigestion (Max 4 doses/24 hours.).   Yes Historical Masson Nalepa, MD  Cranberry Juice Powder 425 MG CAPS Take 425 mg by mouth 2 (two) times daily.   Yes Historical Samanthan Dugo, MD  donepezil (ARICEPT) 10 MG tablet Take 10 mg by mouth at bedtime.    Yes Historical Helvi Royals, MD  Emollient (EUCERIN) lotion Apply topically daily. Apply to bilateral arms and legs for dry skin   Yes  Historical Waller Marcussen, MD  escitalopram (LEXAPRO) 20 MG tablet Take 20 mg by mouth daily.   Yes Historical Dung Prien, MD  furosemide (LASIX) 20 MG tablet Take 10 mg by mouth every morning.    Yes Historical Jesiel Garate, MD  lisinopril (PRINIVIL,ZESTRIL) 5 MG tablet Take 5 mg by mouth every morning.   Yes Historical Wesly Whisenant, MD  liver oil-zinc oxide (DESITIN) 40 % ointment Apply 1 application topically as needed for irritation.   Yes Historical Phinneas Shakoor, MD  loperamide (IMODIUM) 2 MG capsule Take 2 mg by mouth as needed for diarrhea or loose stools.   Yes Historical Jaxtyn Linville, MD  metoprolol succinate (TOPROL-XL) 25 MG 24 hr tablet Take 12.5 mg by mouth daily.   Yes Historical Manual Navarra, MD  ondansetron (ZOFRAN ODT) 8 MG disintegrating tablet Take 1 tablet (8 mg total) by mouth every 8 (eight) hours as needed for nausea. 03/17/12  Yes Ward Givens, MD  OVER THE COUNTER MEDICATION Take 400 mg by mouth every 6 (six) hours as needed (for cough). "Mucas Relief"   Yes Historical Valene Villa, MD  potassium chloride 20 MEQ/15ML (10%) solution Take 10 mEq by mouth daily.   Yes Historical Jessicah Croll, MD  QUEtiapine (SEROQUEL) 25 MG tablet Take 37.5 mg by mouth at bedtime.   Yes Historical Gavynn Duvall, MD  sitaGLIPtin (JANUVIA) 25  MG tablet Take 12.5 mg by mouth daily.   Yes Historical Tomeko Scoville, MD  traMADol (ULTRAM) 50 MG tablet Take 50 mg by mouth 2 (two) times daily.    Yes Historical Anthonymichael Munday, MD  guaifenesin (ROBITUSSIN) 100 MG/5ML syrup Take 100 mg by mouth 3 (three) times daily as needed for cough.    Historical Safiyyah Vasconez, MD  Iloperidone (FANAPT) 2 MG TABS Take 1 mg by mouth 2 (two) times daily. To clear thoughts    Historical Yeslin Delio, MD  insulin aspart (NOVOLOG) 100 UNIT/ML injection Inject 4 Units into the skin 3 (three) times daily with meals. 04/23/12   Richarda OverlieNayana Abrol, MD  insulin glargine (LANTUS) 100 UNIT/ML injection Inject 0.2 mLs (20 Units total) into the skin at bedtime. 04/23/12   Richarda OverlieNayana Abrol, MD  memantine  (NAMENDA) 10 MG tablet Take 10 mg by mouth 2 (two) times daily.    Historical Gradie Ohm, MD  Methylfol-Methylcob-Acetylcyst (METAFOLBIC PLUS) 6-2-600 MG TABS Take 1 tablet by mouth daily.    Historical Nickolas Chalfin, MD  simvastatin (ZOCOR) 20 MG tablet Take 20 mg by mouth every evening.    Historical Kharisma Glasner, MD  Vitamin D, Ergocalciferol, (DRISDOL) 50000 UNITS CAPS Take 50,000 Units by mouth every Tuesday.    Historical Blinda Turek, MD   BP 150/81  Pulse 101  Temp(Src) 98.8 F (37.1 C) (Oral)  Resp 20  SpO2 97% Physical Exam  Constitutional: She appears distressed.  Unwell-appearing elderly female minimally responsive, only to voice  HENT:  Head: Normocephalic and atraumatic.  Eyes: Right eye exhibits no discharge. Left eye exhibits no discharge.  Eyes closed, on opening, the patient does not track  Neck: No tracheal deviation present.  Cardiovascular: Tachycardia present.   Pulmonary/Chest: Stridor present. Tachypnea noted. She has decreased breath sounds.  Abdominal: Soft. She exhibits no distension.  Neurological:  Response to voice, painful stimuli, moves all extremities to painful stimuli, otherwise is listless  No facial asymmetry, does not speak  Skin: Skin is warm. She is diaphoretic.  Psychiatric: Cognition and memory are impaired.    ED Course  Procedures (including critical care time) Labs Review Labs Reviewed  CBC - Abnormal; Notable for the following:    WBC 20.7 (*)    RBC 5.26 (*)    Hemoglobin 15.9 (*)    HCT 47.9 (*)    All other components within normal limits  CBG MONITORING, ED - Abnormal; Notable for the following:    Glucose-Capillary 537 (*)    All other components within normal limits  COMPREHENSIVE METABOLIC PANEL  PROTIME-INR  URINALYSIS, ROUTINE W REFLEX MICROSCOPIC    Imaging Review No results found.   EKG Interpretation   Date/Time:  Tuesday July 20 2013 12:13:03 EDT Ventricular Rate:  115 PR Interval:    QRS Duration: 88 QT Interval:   311 QTC Calculation: 430 R Axis:   69 Text Interpretation:  Junctional tachycardia Nonspecific repol  abnormality, diffuse leads Minimal ST elevation, inferior leads Baseline  wander in lead(s) I III aVL aVF V2 tachycardia- unclear origen. Artifact  Abnormal ekg Confirmed by Gerhard MunchLOCKWOOD, ROBERT  MD [phone] on 07/20/2013  12:56:27 PM     After the initial evaluation I attempted to contact the patient's nursing facility, hospice care, unsuccessfully at first.   [facility] [phone]  12:59 PM Hospice notes state that the family requested transfer.  Attempts to reach family were unsuccessful.   2:05 PM Patient w AKI, hyperglycemia, elevated BNP, possible UTI.   I again confirmed goals of  care w family. With multi-system involvement, there is concern for probable progression of disease.   MDM  Patient presents from nursing home with decreased mental status. Patient already has dementia, prior strokes, is a phasic. Patient's evaluation and suggest acute injury, worsening heart failure, possible UTI.    patient is enrolled in hospice care already, and after discussion with their staff, family members, we agreed no additional interventions beyond fluids, comfort measures, with eventual disposition of inpatient hospice seems reasonable.   Gerhard Munchobert Lockwood, MD 07/20/13 1415

## 2013-07-20 NOTE — H&P (Signed)
[facility] History and Physical  [name] [name] ZOX:[id number] DOB: 14-Feb-1921 DOA: 07/20/2013  Referring physician:  PCP: Grace Isaac, MD  Specialists:   Chief Complaint: worsening of mental status   HPI: [name] [name] is a 80 y.o. female with PMH of HTN, CHF, h/o CVA,  recurrent UTIs, End stage dementia, aphasia at baseline, patient from SNF presented with concerns of altered mental status. Per report patient was awake at baseline, but today was found to be less interactive. Patient found to have leukocytosis sepsis, likely UTI;  -Patient is already under hospice care, family is interested in residential hospice   Review of Systems: The patient denies anorexia, fever, weight loss,, vision loss, decreased hearing, hoarseness, chest pain, syncope, dyspnea on exertion, peripheral edema, balance deficits, hemoptysis, abdominal pain, melena, hematochezia, severe indigestion/heartburn, hematuria, incontinence, genital sores, muscle weakness, suspicious skin lesions, transient blindness, difficulty walking, depression, unusual weight change, abnormal bleeding, enlarged lymph nodes, angioedema, and breast masses.    Past Medical History  Diagnosis Date  . Alzheimers disease   . Vascular dementia   . Psychosis   . Hypertension   . CHF (congestive heart failure)    History reviewed. No pertinent past surgical history. Social History:  reports that she has never smoked. She does not have any smokeless tobacco history on file. She reports that she does not drink alcohol or use illicit drugs. SNF where does patient live--home, ALF, SNF? and with whom if at home? No;  Can patient participate in ADLs?  No Known Allergies  History reviewed. No pertinent family history.  (be sure to complete)  Prior to Admission medications   Medication Sig Start Date End Date Taking? Authorizing Provider  acetaminophen (TYLENOL) 325 MG tablet Take 650 mg by mouth every 4 (four) hours as needed for  pain.   Yes Historical Provider, MD  bismuth subsalicylate (PEPTO BISMOL) 262 MG/15ML suspension Take 30 mLs by mouth every hour as needed for indigestion (Max 4 doses/24 hours.).   Yes Historical Provider, MD  Cranberry Juice Powder 425 MG CAPS Take 425 mg by mouth 2 (two) times daily.   Yes Historical Provider, MD  donepezil (ARICEPT) 10 MG tablet Take 10 mg by mouth at bedtime.    Yes Historical Provider, MD  Emollient (EUCERIN) lotion Apply topically daily. Apply to bilateral arms and legs for dry skin   Yes Historical Provider, MD  escitalopram (LEXAPRO) 20 MG tablet Take 20 mg by mouth daily.   Yes Historical Provider, MD  furosemide (LASIX) 20 MG tablet Take 10 mg by mouth every morning.    Yes Historical Provider, MD  lisinopril (PRINIVIL,ZESTRIL) 5 MG tablet Take 5 mg by mouth every morning.   Yes Historical Provider, MD  liver oil-zinc oxide (DESITIN) 40 % ointment Apply 1 application topically as needed for irritation.   Yes Historical Provider, MD  loperamide (IMODIUM) 2 MG capsule Take 2 mg by mouth as needed for diarrhea or loose stools.   Yes Historical Provider, MD  metoprolol succinate (TOPROL-XL) 25 MG 24 hr tablet Take 12.5 mg by mouth daily.   Yes Historical Provider, MD  ondansetron (ZOFRAN ODT) 8 MG disintegrating tablet Take 1 tablet (8 mg total) by mouth every 8 (eight) hours as needed for nausea. 03/17/12  Yes Ward Givens, MD  OVER THE COUNTER MEDICATION Take 400 mg by mouth every 6 (six) hours as needed (for cough). "Mucas Relief"   Yes Historical Provider, MD  potassium chloride 20 MEQ/15ML (10%) solution Take  10 mEq by mouth daily.   Yes Historical Provider, MD  QUEtiapine (SEROQUEL) 25 MG tablet Take 37.5 mg by mouth at bedtime.   Yes Historical Provider, MD  sitaGLIPtin (JANUVIA) 25 MG tablet Take 12.5 mg by mouth daily.   Yes Historical Provider, MD  traMADol (ULTRAM) 50 MG tablet Take 50 mg by mouth 2 (two) times daily.    Yes Historical Provider, MD   Physical  Exam: Filed Vitals:   07/20/13 1400  BP: 156/75  Pulse: 89  Temp:   Resp: 18     General:  Nonverbal   Eyes: eom-i  ENT: no oral ulcers   Neck: supple   Cardiovascular: s1,s2 rrr  Respiratory: few crackles in LL  Abdomen: soft, nt,nd   Skin: some ecchymosis   Musculoskeletal: no LE edema  Psychiatric: demented   Neurologic: able to move extremities, does not follow commands   Labs on Admission:  Basic Metabolic Panel:  Recent Labs Lab 07/20/13 1240  NA 147  K 4.4  CL 105  CO2 23  GLUCOSE 551*  BUN 59*  CREATININE 1.67*  CALCIUM 10.0   Liver Function Tests:  Recent Labs Lab 07/20/13 1240  AST 13  ALT 20  ALKPHOS 80  BILITOT 0.5  PROT 7.8  ALBUMIN 4.0   No results found for this basename: LIPASE, AMYLASE,  in the last 168 hours No results found for this basename: AMMONIA,  in the last 168 hours CBC:  Recent Labs Lab 07/20/13 1240  WBC 20.7*  HGB 15.9*  HCT 47.9*  MCV 91.1  PLT 268   Cardiac Enzymes: No results found for this basename: CKTOTAL, CKMB, CKMBINDEX, TROPONINI,  in the last 168 hours  BNP (last 3 results)  Recent Labs  07/20/13 1240  PROBNP 4206.0*   CBG:  Recent Labs Lab 07/20/13 1229  GLUCAP 537*    Radiological Exams on Admission: Dg Chest Port 1 View  07/20/2013   CLINICAL DATA:  Unresponsive, altered mental status  EXAM: PORTABLE CHEST - 1 VIEW  COMPARISON:  04/17/2012  FINDINGS: Cardiomediastinal silhouette is stable. No acute infiltrate or pleural effusion. No pulmonary edema. Degenerative changes bilateral shoulders.  IMPRESSION: No active disease.   Electronically Signed   By: Natasha MeadLiviu  Pop M.D.   On: 07/20/2013 14:19    EKG: Independently reviewed.   Assessment/Plan Active Problems:   UTI (lower urinary tract infection)   Dementia   CHF (congestive heart failure)   80 y.o. female with PMH of HTN, CHF, h/o CVA,  recurrent UTIs, End stage dementia, aphasia at baseline, patient from SNF presented with  concerns of altered mental status. Per report patient was awake at baseline, but today was found to be less interactive. Patient found to have leukocytosis sepsis, likely UTI;  -Patient is already under hospice care, family is interested in residential hospice   1. End stage dementia with infectious complications, likely underlying sepsis, UTI, AKI, progressiev CHF  -Pt is progressively declining; prognosis is poor; Pt is already under hospice care at SNF -Family is interested in residential hospice placement; cont hospice comfort care, consult CM, hospice for residential hospice evaluation   D/w patient's sone confirm -DNR   None;  if consultant consulted, please document name and whether formally or informally consulted  Code Status: DNR (must indicate code status--if unknown or must be presumed, indicate so) Family Communication: d/w patient's son  (indicate person spoken with, if applicable, with phone number if by telephone) Disposition Plan: hospice  (indicate anticipated LOS)  Time spent: >35 minutes   Esperanza SheetsBURIEV, ULUGBEK N [facility] Pager [phone]  If 7PM-7AM, please contact night-coverage [email or web address] Password [email or web address] 07/20/2013, 2:51 PM

## 2013-07-21 DIAGNOSIS — A419 Sepsis, unspecified organism: Secondary | ICD-10-CM

## 2013-07-21 MED ORDER — CRANBERRY JUICE POWDER 425 MG PO CAPS: 425.0000 mg | ORAL_CAPSULE | Freq: Two times a day (BID) | ORAL | Status: AC

## 2013-07-21 MED ORDER — ONDANSETRON 8 MG PO TBDP: 8.0000 mg | ORAL_TABLET | Freq: Three times a day (TID) | ORAL | Status: AC | PRN

## 2013-07-21 MED ORDER — DONEPEZIL HCL 10 MG PO TABS: 10.0000 mg | ORAL_TABLET | Freq: Every day | ORAL | Status: AC

## 2013-07-21 MED ORDER — MORPHINE SULFATE (CONCENTRATE) 10 MG /0.5 ML PO SOLN: 10.0000 mg | ORAL | Status: AC | PRN

## 2013-07-21 MED ORDER — BISMUTH SUBSALICYLATE 262 MG/15ML PO SUSP: 30.0000 mL | ORAL | Status: AC | PRN

## 2013-07-21 MED ORDER — LOPERAMIDE HCL 2 MG PO CAPS: 2.0000 mg | ORAL_CAPSULE | ORAL | Status: AC | PRN

## 2013-07-21 MED ORDER — LORAZEPAM 2 MG/ML PO CONC: 1.0000 mg | Freq: Four times a day (QID) | ORAL | Status: AC | PRN

## 2013-07-21 NOTE — Discharge Instructions (Signed)
Being discharged to residential hospice: Of care comfort only.  Regular Diet with assistance aspiration precautions for comfort only  Guarded prognosis  Acuity with assistance as tolerated

## 2013-07-21 NOTE — Clinical Social Work Note (Signed)
Clinical Social Worker facilitated patient discharge including contacting patient family and facility to confirm patient discharge plans.  Clinical information faxed to facility and family agreeable with plan.  CSW arranged ambulance transport via [facility] to [facility].  RN to call report prior to discharge.  Clinical Social Worker will sign off for now as social work intervention is no longer needed. Please consult us again if new need arises.  Jesse Yaviel Kloster, LCSW [phone] 

## 2013-07-21 NOTE — Progress Notes (Signed)
[facility].

## 2013-07-21 NOTE — Progress Notes (Signed)
Nutrition Brief Note  Per review of chart, plans are for comfort care per family wishes. No nutrition interventions warranted at this time. Please consult nutrition as needed.   Joaquin CourtsKimberly Venna Berberich, RD, LDN, CNSC Pager [phone] After Hours Pager [phone]

## 2013-07-21 NOTE — Progress Notes (Signed)
UR completed 

## 2013-07-21 NOTE — Progress Notes (Signed)
Report given to Marylene LandAngela, RN of [facility]. Requested to keep IV sites to saline lock.

## 2013-07-21 NOTE — Clinical Social Work Note (Signed)
Clinical Social Work Department BRIEF PSYCHOSOCIAL ASSESSMENT 07/21/2013  Patient:  [name] [name],[name] [name]     Account Number:  [id number]     Admit date:  07/20/2013  Clinical Social Worker:  Verl BlalockSCINTO,JESSE, LCSW  Date/Time:  07/21/2013 10:00 AM  Referred by:  RN  Date Referred:  07/21/2013 Referred for  Residential hospice placement   Other Referral:   Interview type:  Family Other interview type:   Spoke with patient son over the phone    PSYCHOSOCIAL DATA Living Status:  FACILITY [facility] [place] Level of care:  Assisted Living Primary support name:  [name],[name]  [phone] Primary support relationship to patient:  CHILD, ADULT Degree of support available:   Strong    CURRENT CONCERNS Current Concerns  Post-Acute Placement   Other Concerns:    SOCIAL WORK ASSESSMENT / PLAN Clinical Social Worker spoke with patient son over the phone to offer support and discuss patient plans at discharge.  Patient son states that [facility] and was hospitalized for altered mental status and decreased level of consciousness.  Patient was already involved with [facility] and they remained involved following patient admission.  [facility] with [facility] to assess patient for eligibility.  CSW received phone call from [facility] who states patient is eligible and has been approved for admission today.  CSW spoke again with patient son who is agreeable with this plan as was currently at the facility completing paperwork.    Clinical Social Worker remains available for support and to facilitate patient discharge needs later today.   Assessment/plan status:  Psychosocial Support/Ongoing Assessment of Needs Other assessment/ plan:   Information/referral to community resources:   Patient son working directly with [facility] to make [facility]    PATIENT'S/FAMILY'S RESPONSE TO PLAN OF CARE: Patient not responsive with no family currently at bedside. Patient with very supportive son who has made arrangements with [facility].  CSW spoke with son over the phone who sounds as though he is coping with the transition to residential hospice.  Patient son realistic on patient prognosis.  Patient son verbalized appreciation for CSW support and concern.

## 2013-07-21 NOTE — Discharge Summary (Signed)
[name] [name], is a 80 y.o. female  DOB 07-Feb-1921  MRN [id number].  Admission date:  07/20/2013  Admitting Physician  Esperanza Sheets, MD  Discharge Date:  07/21/2013   Primary MD  Grace Isaac, MD  Recommendations for primary care physician for things to follow:   Goal of care comfort only   Admission Diagnosis  UTI (lower urinary tract infection) [599.0] Chronic systolic congestive heart failure [428.22, 428.0] Dementia, without behavioral disturbance [294.20] Sepsis, due to unspecified organism [038.9, 995.91]   Discharge Diagnosis  UTI (lower urinary tract infection) [599.0] Chronic systolic congestive heart failure [428.22, 428.0] Dementia, without behavioral disturbance [294.20] Sepsis, due to unspecified organism [038.9, 995.91]     Active Problems:   UTI (lower urinary tract infection)   Dementia   CHF (congestive heart failure)   Sepsis      Past Medical History  Diagnosis Date  . Alzheimers disease   . Vascular dementia   . Psychosis   . Hypertension   . CHF (congestive heart failure)     History reviewed. No pertinent past surgical history.   Discharge Condition: Guarded   Follow UP      Discharge Instructions  and  Discharge Medications     Discharge Instructions   Discharge instructions    Complete by:  As directed   Being discharged to residential hospice: Of care comfort only.  Regular Diet with assistance aspiration precautions for comfort only  Guarded prognosis  Acuity with assistance as tolerated  Being discharged to residential hospice: Of care comfort only.  Regular Diet with assistance aspiration precautions for comfort only  Guarded prognosis  Acuity with assistance as tolerated            Medication List    STOP taking these medications       acetaminophen 325 MG tablet  Commonly known as:  TYLENOL     escitalopram 20 MG tablet  Commonly known as:  LEXAPRO     eucerin lotion     furosemide 20 MG tablet  Commonly known as:  LASIX     lisinopril 5 MG tablet  Commonly known as:  PRINIVIL,ZESTRIL     liver oil-zinc oxide 40 % ointment  Commonly known as:  DESITIN     metoprolol succinate 25 MG 24 hr tablet  Commonly known as:  TOPROL-XL     OVER THE COUNTER MEDICATION     potassium chloride 20 MEQ/15ML (10%) solution     QUEtiapine 25 MG tablet  Commonly known as:  SEROQUEL     sitaGLIPtin 25 MG tablet  Commonly known as:  JANUVIA     traMADol 50 MG tablet  Commonly known as:  ULTRAM      TAKE these medications       bismuth subsalicylate 262 MG/15ML suspension  Commonly known as:  PEPTO BISMOL  Take 30 mLs by mouth every hour as needed for indigestion (Max 4 doses/24 hours.).     Cranberry Juice Powder 425 MG Caps  Take  1 capsule (425 mg total) by mouth 2 (two) times daily.     donepezil 10 MG tablet  Commonly known as:  ARICEPT  Take 1 tablet (10 mg total) by mouth at bedtime.     loperamide 2 MG capsule  Commonly known as:  IMODIUM  Take 1 capsule (2 mg total) by mouth as needed for diarrhea or loose stools.     LORazepam 2 MG/ML concentrated solution  Commonly known as:  ATIVAN  Take 0.5 mLs (1 mg total) by mouth every 6 (six) hours as needed for anxiety.     morphine CONCENTRATE 10 mg / 0.5 ml concentrated solution  Take 0.5 mLs (10 mg total) by mouth every 3 (three) hours as needed for moderate pain or severe pain.     ondansetron 8 MG disintegrating tablet  Commonly known as:  ZOFRAN ODT  Take 1 tablet (8 mg total) by mouth every 8 (eight) hours as needed for nausea.          Diet and Activity recommendation: See Discharge Instructions above   Consults obtained - Pall. care   Major procedures and Radiology Reports - PLEASE review detailed and final reports for all details, in  brief -       Dg Chest Port 1 View  07/20/2013   CLINICAL DATA:  Unresponsive, altered mental status  EXAM: PORTABLE CHEST - 1 VIEW  COMPARISON:  04/17/2012  FINDINGS: Cardiomediastinal silhouette is stable. No acute infiltrate or pleural effusion. No pulmonary edema. Degenerative changes bilateral shoulders.  IMPRESSION: No active disease.   Electronically Signed   By: Natasha MeadLiviu  Pop M.D.   On: 07/20/2013 14:19    Micro Results      Recent Results (from the past 240 hour(s))  MRSA PCR SCREENING     Status: None   Collection Time    07/20/13  6:21 PM      Result Value Ref Range Status   MRSA by PCR NEGATIVE  NEGATIVE Final   Comment:            The GeneXpert MRSA Assay (FDA     approved for NASAL specimens     only), is one component of a     comprehensive MRSA colonization     surveillance program. It is not     intended to diagnose MRSA     infection nor to guide or     monitor treatment for     MRSA infections.     History of present illness and  Hospital Course:     Kindly see H&P for history of present illness and admission details, please review complete Labs, Consult reports and Test reports for all details in brief  HPI: [name] Krausslaine M [name] is a 80 y.o. female with PMH of HTN, CHF, h/o CVA, recurrent UTIs, End stage dementia, aphasia at baseline, patient from SNF presented with concerns of altered mental status. Per report patient was awake at baseline, but today was found to be less interactive. Patient found to have leukocytosis sepsis, likely UTI; -Patient is already under hospice care, family is interested in residential hospice     Hospital Course   Patient was admitted from an assisted living facility where she was being followed by hospice team, she experienced gradual decline to the point that family and hospice team wanted her to be placed in a residential hospice for which she was brought to the hospital yesterday, she was seen here palliative care team, essential  hospice his placement  has been arranged, her medications have been adjusted directed towards comfort only. All other medications will be stopped. Goal of care will be comfort only begin the standing the patient will pass away soon. Diet and activity as tolerated for comfort only.     Today   Subjective:   [name] [name] today has no headache,no chest abdominal pain,no new weakness tingling or numbness, feels much better wants to go home today.   Objective:   Blood pressure 161/79, pulse 109, temperature 99.5 F (37.5 C), temperature source Axillary, resp. rate 18, SpO2 93.00%.   Intake/Output Summary (Last 24 hours) at 07/21/13 1117 Last data filed at 07/20/13 2234  Gross per 24 hour  Intake      0 ml  Output    200 ml  Net   -200 ml    Exam  Awake but thoroughly confused, No new F.N deficits, Normal affect [place].AT,PERRAL Supple Neck,No JVD, No cervical lymphadenopathy appriciated.  Symmetrical Chest wall movement, Good air movement bilaterally, CTAB RRR,No Gallops,Rubs or new Murmurs, No Parasternal Heave +ve B.Sounds, Abd Soft, Non tender, No organomegaly appriciated, No rebound -guarding or rigidity. No Cyanosis, Clubbing or edema, No new Rash or bruise   Data Review   CBC w Diff: Lab Results  Component Value Date   WBC 20.7* 07/20/2013   HGB 15.9* 07/20/2013   HCT 47.9* 07/20/2013   PLT 268 07/20/2013   LYMPHOPCT 7* 03/17/2012   MONOPCT 5 03/17/2012   EOSPCT 0 03/17/2012   BASOPCT 0 03/17/2012    CMP: Lab Results  Component Value Date   NA 147 07/20/2013   K 4.4 07/20/2013   CL 105 07/20/2013   CO2 23 07/20/2013   BUN 59* 07/20/2013   CREATININE 1.67* 07/20/2013   PROT 7.8 07/20/2013   ALBUMIN 4.0 07/20/2013   BILITOT 0.5 07/20/2013   ALKPHOS 80 07/20/2013   AST 13 07/20/2013   ALT 20 07/20/2013  .   Total Time in preparing paper work, data evaluation and todays exam - 35 minutes  Leroy SeaSINGH,PRASHANT K M.D on 07/21/2013 at 11:17 AM  [facility] Office   [phone]   **Disclaimer: This note may have been dictated with voice recognition software. Similar sounding words can inadvertently be transcribed and this note may contain transcription errors which may not have been corrected upon publication of note.**

## 2013-08-16 IMAGING — CR DG CHEST 1V PORT
1 series · 1 of 1 positions shown · non-contrast
Comparison: 03/17/2012.

CLINICAL DATA: Altered level of consciousness.  Fell.

PORTABLE CHEST - 1 VIEW

[AP]
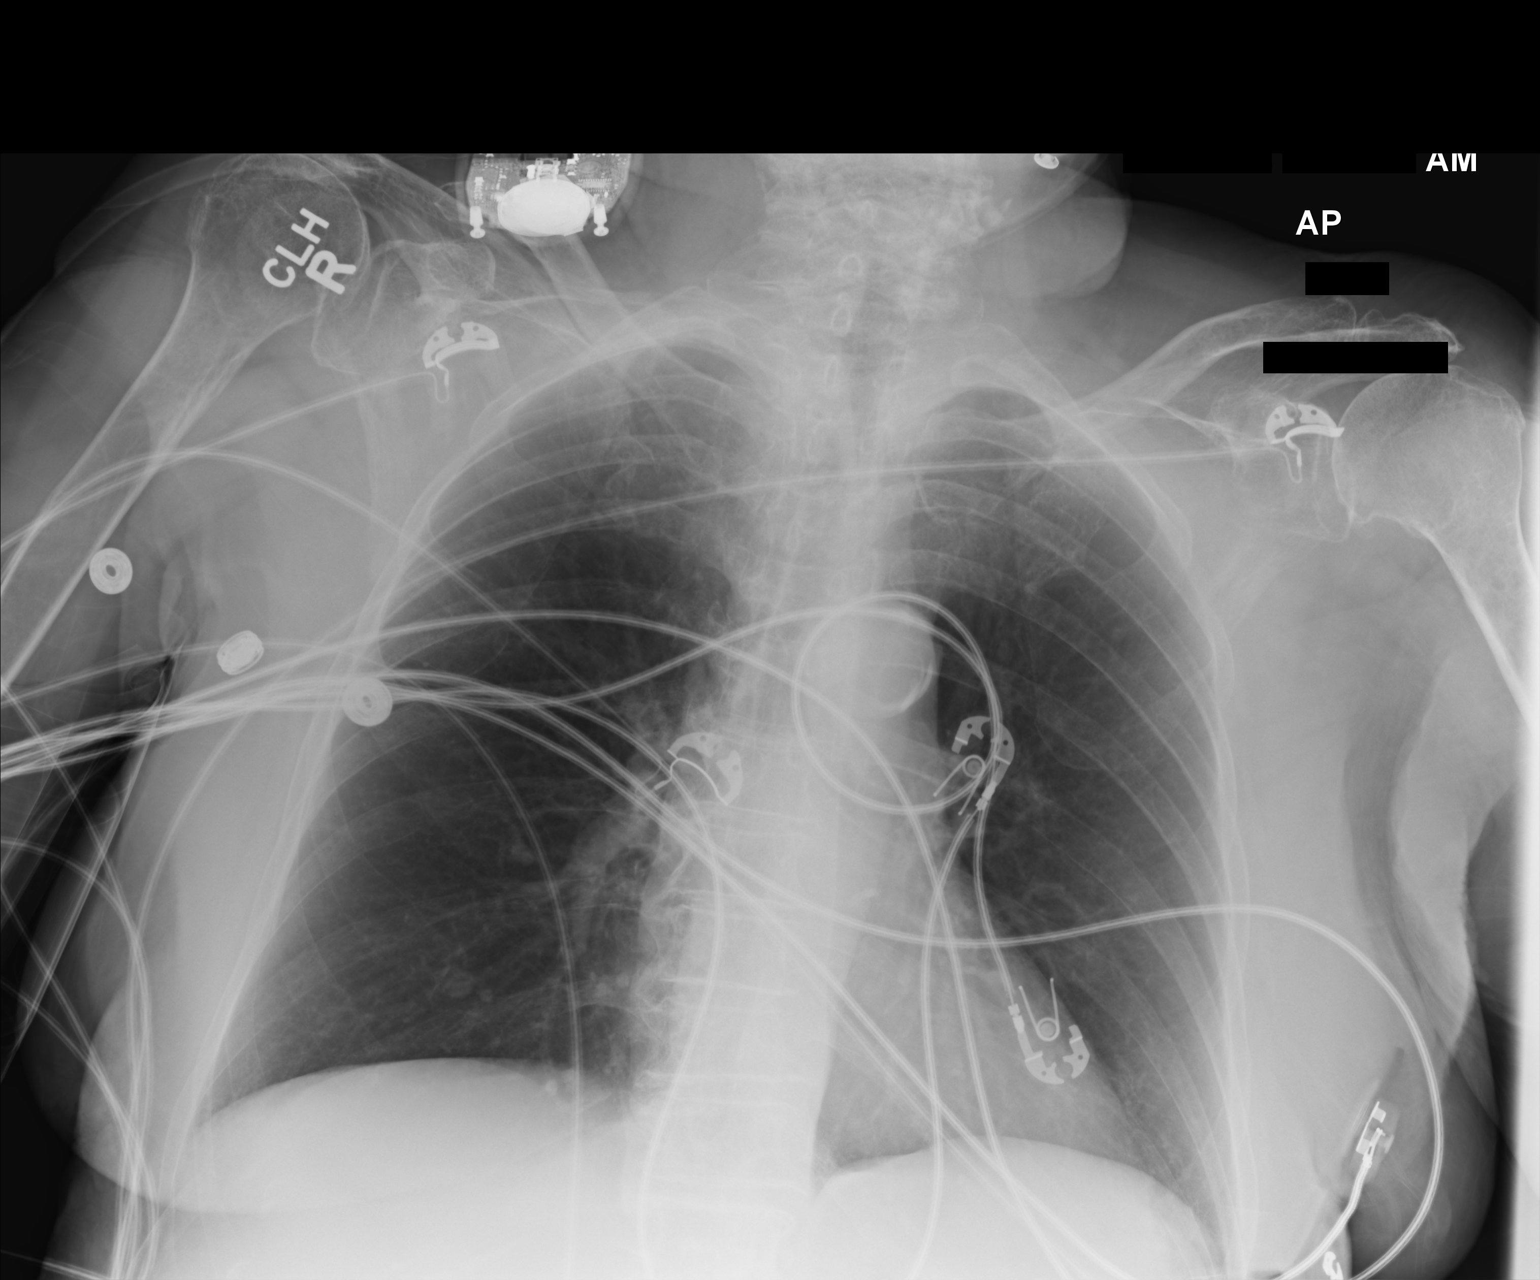

[1 of 1 positions shown; findings below may reference images not displayed]

FINDINGS: The cardiac silhouette, mediastinal and hilar contours
are normal and stable.  There is tortuosity and calcification of
the thoracic aorta.  The lungs are clear of acute process.  No
pleural effusion.  The bony thorax is intact.
IMPRESSION: No acute cardiopulmonary findings.

## 2013-08-16 IMAGING — CT CT HEAD W/O CM
1 of 2 series · 16 of 30 positions shown, 20 images · non-contrast
Comparison: None.

CLINICAL DATA: Altered mental status.  Fall from bed.

CT HEAD WITHOUT CONTRAST
TECHNIQUE: Contiguous axial images were obtained from the base of
the skull through the vertex without contrast.

[Series 3: recon 2: brain · axial · 0.48mm/px · z∈[+129,+274]mm · 16 of 64 slices shown, 20 images]
[im 4/64  brain]
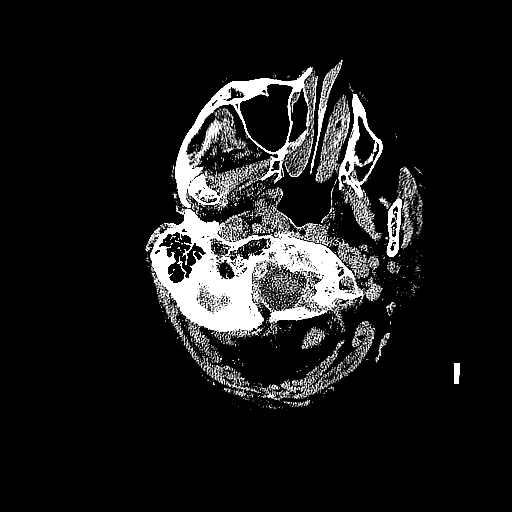
[im 4/64  bone]
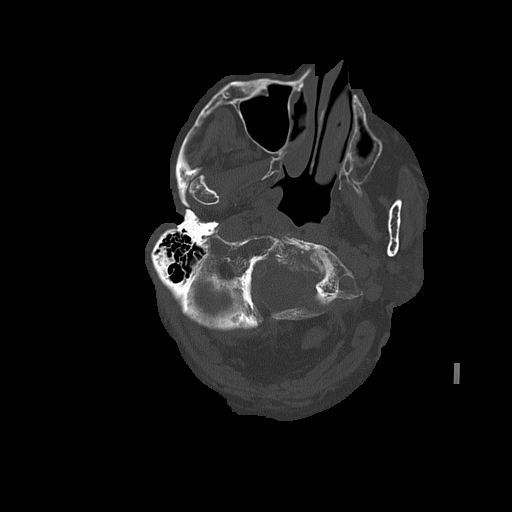
[im 7/64  brain]
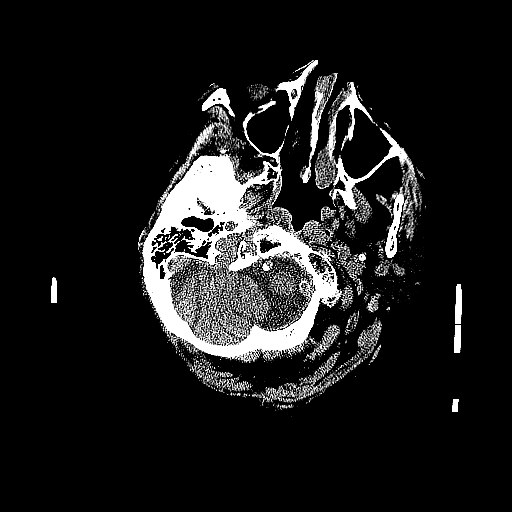
[im 10/64  brain]
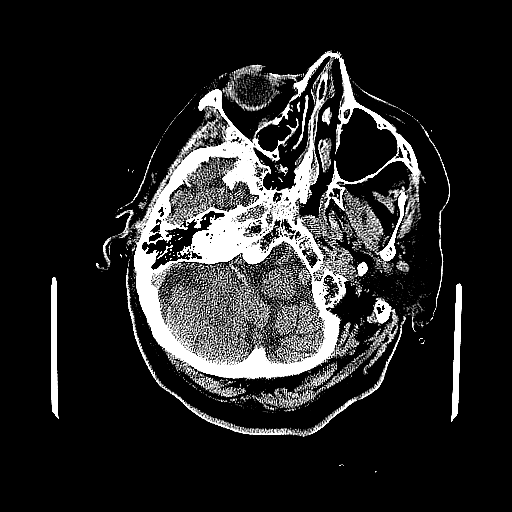
[im 14/64  brain]
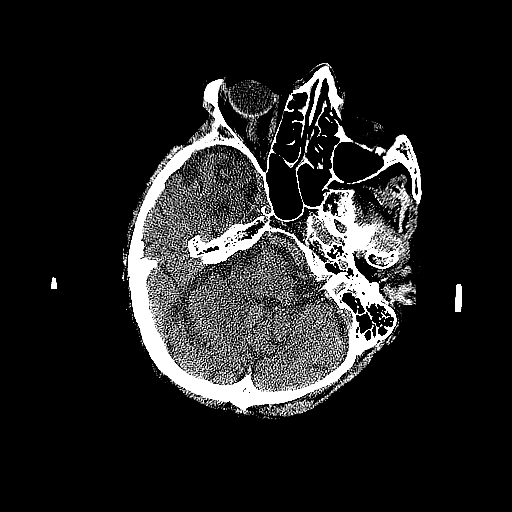
[im 20/64  brain]
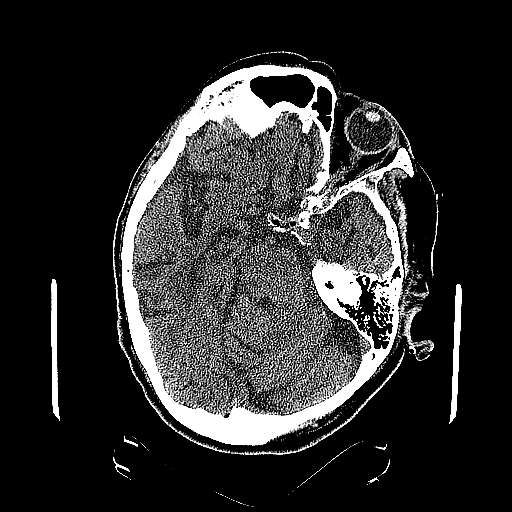
[im 20/64  bone]
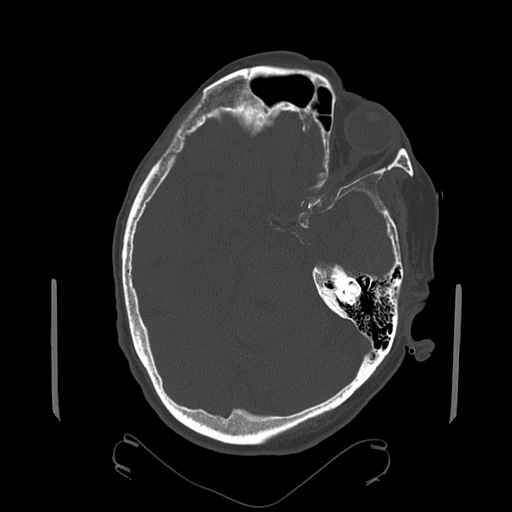
[im 24/64  brain]
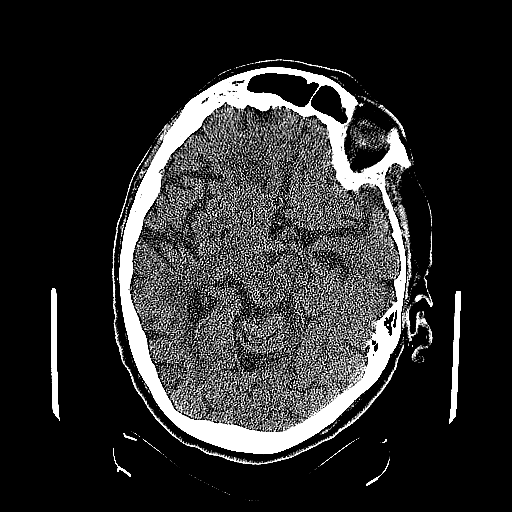
[im 27/64  brain]
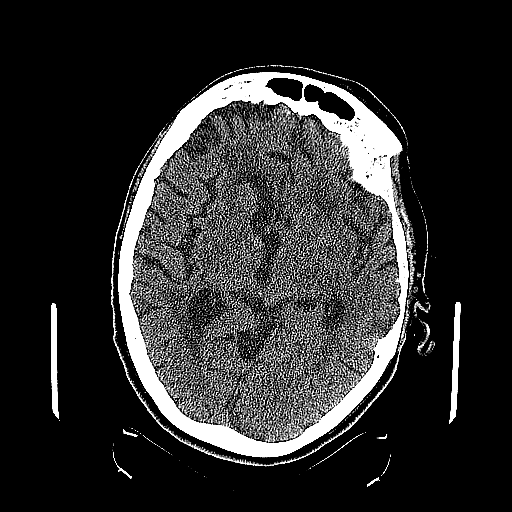
[im 30/64  brain]
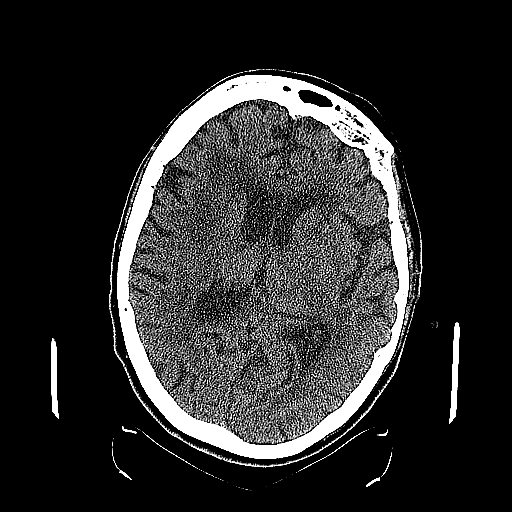
[im 34/64  brain]
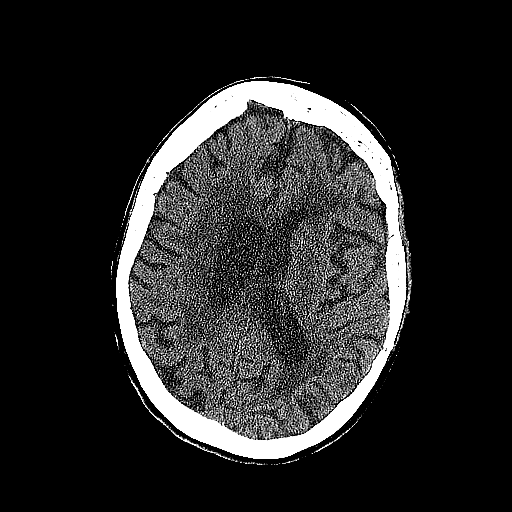
[im 34/64  bone]
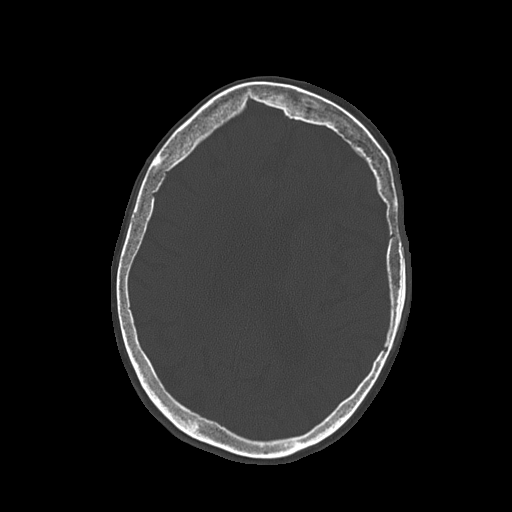
[im 37/64  brain]
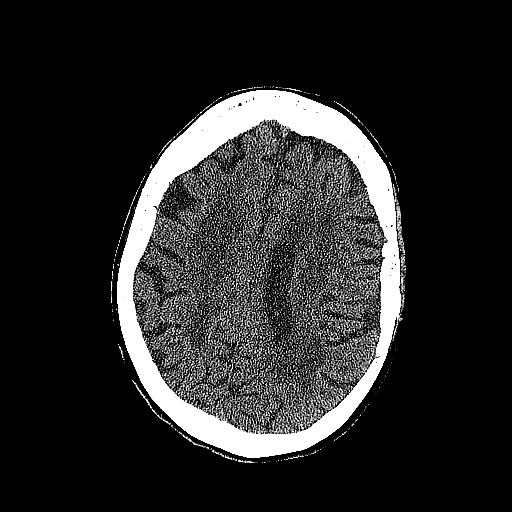
[im 40/64  brain]
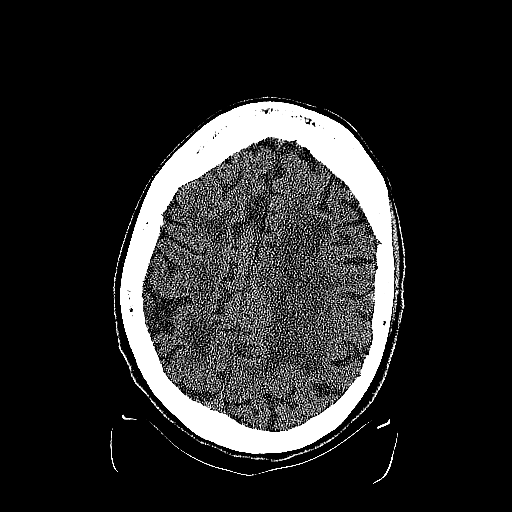
[im 44/64  brain]
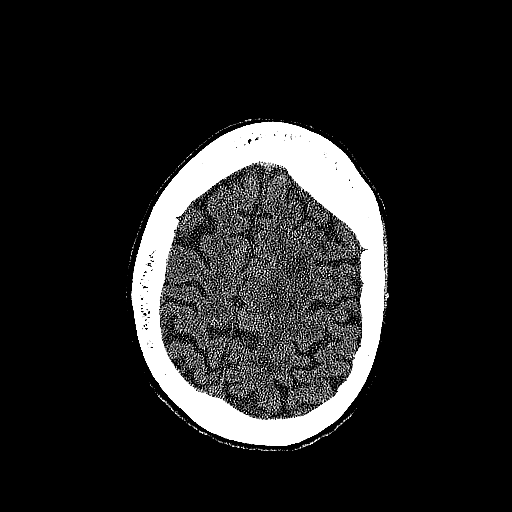
[im 50/64  brain]
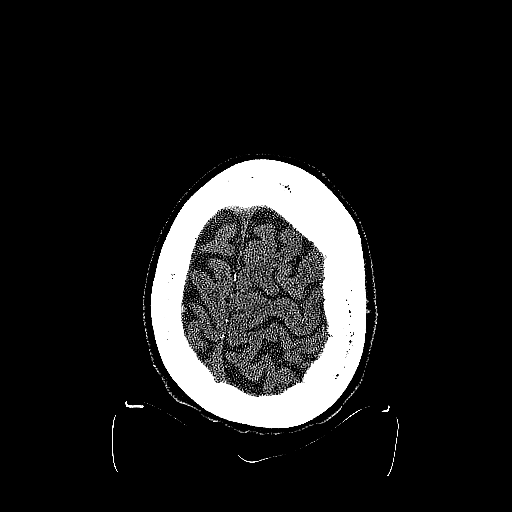
[im 50/64  bone]
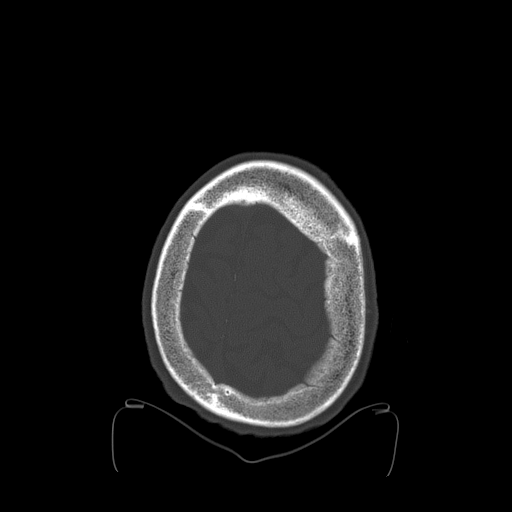
[im 54/64  brain]
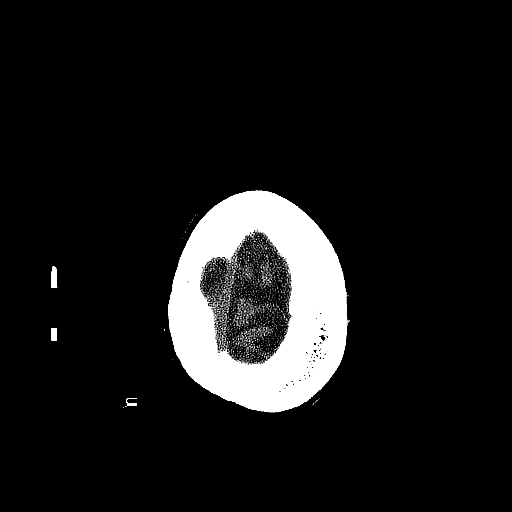
[im 57/64  brain]
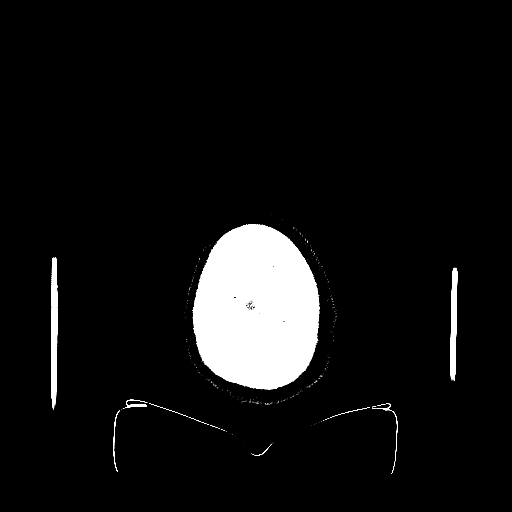
[im 60/64  brain]
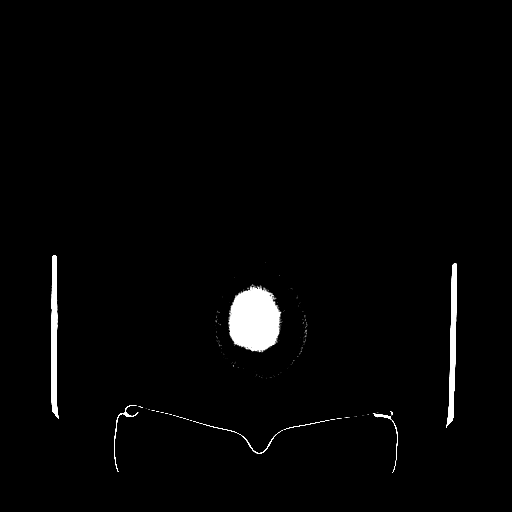

[16 of 30 positions shown; findings below may reference images not displayed]

FINDINGS: No mass lesion, mass effect, midline shift,
hydrocephalus, hemorrhage.  No acute territorial cortical
ischemia/infarct. Atrophy and chronic ischemic white matter disease
is present.  Old left thalamic lacunar infarct.  Calvarium intact.
No skull fracture.  Temporomandibular joint osteoarthritis is
present bilaterally.
IMPRESSION: Atrophy and chronic ischemic white matter disease without acute
intracranial abnormality.

## 2013-08-21 DEATH — deceased

## 2018-06-22 DEATH — deceased
